# Patient Record
Sex: Male | Born: 1995 | Race: White | Hispanic: No | Marital: Married | Smoking: Never smoker
Health system: Southern US, Community
[De-identification: ages and names within clinical notes are randomized; demographics above are authoritative.]

## PROBLEM LIST (undated history)

## (undated) DIAGNOSIS — E669 Obesity, unspecified: Secondary | ICD-10-CM

## (undated) DIAGNOSIS — K219 Gastro-esophageal reflux disease without esophagitis: Secondary | ICD-10-CM

## (undated) DIAGNOSIS — M1990 Unspecified osteoarthritis, unspecified site: Secondary | ICD-10-CM

## (undated) HISTORY — PX: FINGER SURGERY: SHX640

## (undated) HISTORY — DX: Gastro-esophageal reflux disease without esophagitis: K21.9

## (undated) HISTORY — PX: TONSILLECTOMY: SUR1361

## (undated) HISTORY — DX: Obesity, unspecified: E66.9

## (undated) HISTORY — DX: Unspecified osteoarthritis, unspecified site: M19.90

## (undated) HISTORY — PX: TONSILECTOMY, ADENOIDECTOMY, BILATERAL MYRINGOTOMY AND TUBES: SHX2538

---
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
## 2015-04-09 ENCOUNTER — Ambulatory Visit (INDEPENDENT_AMBULATORY_CARE_PROVIDER_SITE_OTHER): Payer: BLUE CROSS/BLUE SHIELD | Admitting: Family Medicine

## 2015-04-09 ENCOUNTER — Encounter: Payer: Self-pay | Admitting: Family Medicine

## 2015-04-09 VITALS — BP 125/76 | HR 55 | Temp 97.9°F | Ht 73.73 in | Wt 220.6 lb

## 2015-04-09 DIAGNOSIS — L720 Epidermal cyst: Secondary | ICD-10-CM | POA: Diagnosis not present

## 2015-04-09 MED ORDER — NAPROXEN 500 MG PO TABS: 500.0000 mg | ORAL_TABLET | Freq: Two times a day (BID) | ORAL | Status: DC

## 2015-04-09 MED ORDER — CEPHALEXIN 500 MG PO CAPS: 500.0000 mg | ORAL_CAPSULE | Freq: Four times a day (QID) | ORAL | Status: DC

## 2015-04-09 NOTE — Patient Instructions (Signed)
Nice to meet you!  I think you have a cyst on your face that keeps getting infected. I wil treat the infection but you should see a dermatologist to get it removed.   PLease call [facility] for your back  Try the naproxen for pain instead of the mobic.    Epidermal Cyst An epidermal cyst is sometimes called a sebaceous cyst, epidermal inclusion cyst, or infundibular cyst. These cysts usually contain a substance that looks "pasty" or "cheesy" and may have a bad smell. This substance is a protein called keratin. Epidermal cysts are usually found on the face, neck, or trunk. They may also occur in the vaginal area or other parts of the genitalia of both men and women. Epidermal cysts are usually small, painless, slow-growing bumps or lumps that move freely under the skin. It is important not to try to pop them. This may cause an infection and lead to tenderness and swelling. CAUSES  Epidermal cysts may be caused by a deep penetrating injury to the skin or a plugged hair follicle, often associated with acne. SYMPTOMS  Epidermal cysts can become inflamed and cause:  Redness.  Tenderness.  Increased temperature of the skin over the bumps or lumps.  Grayish-white, bad smelling material that drains from the bump or lump. DIAGNOSIS  Epidermal cysts are easily diagnosed by your caregiver during an exam. Rarely, a tissue sample (biopsy) may be taken to rule out other conditions that may resemble epidermal cysts. TREATMENT   Epidermal cysts often get better and disappear on their own. They are rarely ever cancerous.  If a cyst becomes infected, it may become inflamed and tender. This may require opening and draining the cyst. Treatment with antibiotics may be necessary. When the infection is gone, the cyst may be removed with minor surgery.  Small, inflamed cysts can often be treated with antibiotics or by injecting steroid medicines.  Sometimes, epidermal cysts become large and  bothersome. If this happens, surgical removal in your caregiver's office may be necessary. HOME CARE INSTRUCTIONS  Only take over-the-counter or prescription medicines as directed by your caregiver.  Take your antibiotics as directed. Finish them even if you start to feel better. SEEK MEDICAL CARE IF:   Your cyst becomes tender, red, or swollen.  Your condition is not improving or is getting worse.  You have any other questions or concerns. MAKE SURE YOU:  Understand these instructions.  Will watch your condition.  Will get help right away if you are not doing well or get worse.   This information is not intended to replace advice given to you by your health care provider. Make sure you discuss any questions you have with your health care provider.   Document Released: 05/20/2004 Document Revised: 09/11/2011 Document Reviewed: 12/26/2010 Elsevier Interactive Patient Education [employer]

## 2015-04-09 NOTE — Progress Notes (Signed)
   HPI  Patient presents today here for concern for infection of his face and back pain.  Infection Patient claims he's had a lesion in his left-sided face is been there for at least 3 months. Initially it got large, red and drained bloody clear fluid without bruit was popped 3 months ago. It has persisted to come and go and get larger at times. Last night it began to use blood in clear discharge again.  He denies fevers, chills, sweats.  Back pain Has a long history of intermittent back pain with radicular symptoms. He states that he's been treated by a spinal surgeon with epidural injections, he states that it's getting bad again and that he probably needs to go back to surgery. He denies any problems with his bowels, walking, or saddle anesthesia.  PMH: Smoking status noted ROS: Per HPI  Objective: BP 125/76 mmHg  Pulse 55  Temp(Src) 97.9 F (36.6 C) (Oral)  Ht 6' 1.73" (1.873 m)  Wt 220 lb 9.6 oz (100.064 kg)  BMI 28.52 kg/m2 Gen: NAD, alert, cooperative with exam HEENT: NCAT CV: RRR, good S1/S2, no murmur Resp: CTABL, no wheezes, non-labored Skin: Left face with approximately 1 cm circular erythematous flat lesion, no discharge with expression, mild induration surrounding Neuro Strength 5/5 and sensation intact bilaterally, normal gait  Assessment and plan:  # Likely epidermal inclusion cyst, acute infection Treat with Keflex Refer to dermatology for possibility of removal, considering that this is on his face  # Back pain Neuro exam reassuring With radicular symptoms in his young age of his very appropriate for him to go see spinal surgery, he has a relationship at [facility] orthopedics were asked him to call there He will call if he needs a referral Change mobict to naproxen   Orders Placed This Encounter  Procedures  . Ambulatory referral to Dermatology    Referral Priority:  Routine    Referral Type:  Consultation    Referral Reason:  Specialty Services  Required    Requested Specialty:  Dermatology    Number of Visits Requested:  1    Meds ordered this encounter  Medications  . naproxen (NAPROSYN) 500 MG tablet    Sig: Take 1 tablet (500 mg total) by mouth 2 (two) times daily with a meal. As needed for pain    Dispense:  45 tablet    Refill:  0  . cephALEXin (KEFLEX) 500 MG capsule    Sig: Take 1 capsule (500 mg total) by mouth 4 (four) times daily.    Dispense:  21 capsule    Refill:  0    Murtis Sink, MD [facility] 04/09/2015, 11:44 AM

## 2015-05-11 ENCOUNTER — Telehealth: Payer: Self-pay | Admitting: Family Medicine

## 2015-05-11 NOTE — Telephone Encounter (Signed)
Spoke with father and told him that he could call to make his own appointment; number to Dr. Nita SellsJohn Hall given

## 2015-06-30 ENCOUNTER — Ambulatory Visit (INDEPENDENT_AMBULATORY_CARE_PROVIDER_SITE_OTHER): Payer: BLUE CROSS/BLUE SHIELD | Admitting: Physician Assistant

## 2015-06-30 ENCOUNTER — Encounter: Payer: Self-pay | Admitting: Physician Assistant

## 2015-06-30 VITALS — BP 139/87 | HR 101 | Temp 97.1°F | Ht 73.75 in | Wt 233.2 lb

## 2015-06-30 DIAGNOSIS — H6593 Unspecified nonsuppurative otitis media, bilateral: Secondary | ICD-10-CM | POA: Diagnosis not present

## 2015-06-30 DIAGNOSIS — J069 Acute upper respiratory infection, unspecified: Secondary | ICD-10-CM | POA: Diagnosis not present

## 2015-06-30 MED ORDER — AMOXICILLIN-POT CLAVULANATE 875-125 MG PO TABS: 1.0000 | ORAL_TABLET | Freq: Two times a day (BID) | ORAL | Status: DC

## 2015-06-30 NOTE — Progress Notes (Signed)
Subjective:     Patient ID: Cristian Sellers, male   DOB: 03/15/1996, 19 y.o.   MRN: [id number]  HPI  Bilat ear pain, cough , and congestion for 3-4 days Review of Systems  Constitutional: Positive for fatigue. Negative for fever, activity change and appetite change.  HENT: Positive for congestion, ear pain, postnasal drip, rhinorrhea, sinus pressure and sore throat. Negative for facial swelling.   Respiratory: Positive for cough. Negative for choking and shortness of breath.        Objective:   Physical Exam  Constitutional: He appears well-developed and well-nourished.  HENT:  Right Ear: External ear normal.  Left Ear: External ear normal.  Mouth/Throat: Oropharynx is clear and moist.  Neck: Neck supple.  Cardiovascular: Normal rate, regular rhythm and normal heart sounds.   No murmur heard. Pulmonary/Chest: Effort normal. He has no wheezes.  Lymphadenopathy:    He has no cervical adenopathy.  Nursing note and vitals reviewed.      Assessment:     Bilat OM URI     Plan:     Fluids Rest Augmentin bid x 10 days RF Alb Inh F/U prn

## 2015-06-30 NOTE — Patient Instructions (Signed)
Otitis Media With Effusion °Otitis media with effusion is the presence of fluid in the middle ear. This is a common problem in children, which often follows ear infections. It may be present for weeks or longer after the infection. Unlike an acute ear infection, otitis media with effusion refers only to fluid behind the ear drum and not infection. Children with repeated ear and sinus infections and allergy problems are the most likely to get otitis media with effusion. °CAUSES  °The most frequent cause of the fluid buildup is dysfunction of the eustachian tubes. These are the tubes that drain fluid in the ears to the back of the nose (nasopharynx). °SYMPTOMS  °· The main symptom of this condition is hearing loss. As a result, you or your child may: °¨ Listen to the TV at a loud volume. °¨ Not respond to questions. °¨ Ask "what" often when spoken to. °¨ Mistake or confuse one sound or word for another. °· There may be a sensation of fullness or pressure but usually not pain. °DIAGNOSIS  °· Your health care provider will diagnose this condition by examining you or your child's ears. °· Your health care provider may test the pressure in you or your child's ear with a tympanometer. °· A hearing test may be conducted if the problem persists. °TREATMENT  °· Treatment depends on the duration and the effects of the effusion. °· Antibiotics, decongestants, nose drops, and cortisone-type drugs (tablets or nasal spray) may not be helpful. °· Children with persistent ear effusions may have delayed language or behavioral problems. Children at risk for developmental delays in hearing, learning, and speech may require referral to a specialist earlier than children not at risk. °· You or your child's health care provider may suggest a referral to an ear, nose, and throat surgeon for treatment. The following may help restore normal hearing: °¨ Drainage of fluid. °¨ Placement of ear tubes (tympanostomy tubes). °¨ Removal of adenoids  (adenoidectomy). °HOME CARE INSTRUCTIONS  °· Avoid secondhand smoke. °· Infants who are breastfed are less likely to have this condition. °· Avoid feeding infants while they are lying flat. °· Avoid known environmental allergens. °· Avoid people who are sick. °SEEK MEDICAL CARE IF:  °· Hearing is not better in 3 months. °· Hearing is worse. °· Ear pain. °· Drainage from the ear. °· Dizziness. °MAKE SURE YOU:  °· Understand these instructions. °· Will watch your condition. °· Will get help right away if you are not doing well or get worse. °  °This information is not intended to replace advice given to you by your health care provider. Make sure you discuss any questions you have with your health care provider. °  °Document Released: 07/27/2004 Document Revised: 07/10/2014 Document Reviewed: 01/14/2013 °Elsevier Interactive Patient Education ©2016 Elsevier Inc. °Upper Respiratory Infection, Adult °Most upper respiratory infections (URIs) are a viral infection of the air passages leading to the lungs. A URI affects the nose, throat, and upper air passages. The most common type of URI is nasopharyngitis and is typically referred to as "the common cold." °URIs run their course and usually go away on their own. Most of the time, a URI does not require medical attention, but sometimes a bacterial infection in the upper airways can follow a viral infection. This is called a secondary infection. Sinus and middle ear infections are common types of secondary upper respiratory infections. °Bacterial pneumonia can also complicate a URI. A URI can worsen asthma and chronic obstructive pulmonary disease (  COPD). Sometimes, these complications can require emergency medical care and may be life threatening.  CAUSES Almost all URIs are caused by viruses. A virus is a type of germ and can spread from one person to another.  RISKS FACTORS You may be at risk for a URI if:   You smoke.   You have chronic heart or lung  disease.  You have a weakened defense (immune) system.   You are very young or very old.   You have nasal allergies or asthma.  You work in crowded or poorly ventilated areas.  You work in health care facilities or schools. SIGNS AND SYMPTOMS  Symptoms typically develop 2-3 days after you come in contact with a cold virus. Most viral URIs last 7-10 days. However, viral URIs from the influenza virus (flu virus) can last 14-18 days and are typically more severe. Symptoms may include:   Runny or stuffy (congested) nose.   Sneezing.   Cough.   Sore throat.   Headache.   Fatigue.   Fever.   Loss of appetite.   Pain in your forehead, behind your eyes, and over your cheekbones (sinus pain).  Muscle aches.  DIAGNOSIS  Your health care provider may diagnose a URI by:  Physical exam.  Tests to check that your symptoms are not due to another condition such as:  Strep throat.  Sinusitis.  Pneumonia.  Asthma. TREATMENT  A URI goes away on its own with time. It cannot be cured with medicines, but medicines may be prescribed or recommended to relieve symptoms. Medicines may help:  Reduce your fever.  Reduce your cough.  Relieve nasal congestion. HOME CARE INSTRUCTIONS   Take medicines only as directed by your health care provider.   Gargle warm saltwater or take cough drops to comfort your throat as directed by your health care provider.  Use a warm mist humidifier or inhale steam from a shower to increase air moisture. This may make it easier to breathe.  Drink enough fluid to keep your urine clear or pale yellow.   Eat soups and other clear broths and maintain good nutrition.   Rest as needed.   Return to work when your temperature has returned to normal or as your health care provider advises. You may need to stay home longer to avoid infecting others. You can also use a face mask and careful hand washing to prevent spread of the  virus.  Increase the usage of your inhaler if you have asthma.   Do not use any tobacco products, including cigarettes, chewing tobacco, or electronic cigarettes. If you need help quitting, ask your health care provider. PREVENTION  The best way to protect yourself from getting a cold is to practice good hygiene.   Avoid oral or hand contact with people with cold symptoms.   Wash your hands often if contact occurs.  There is no clear evidence that vitamin C, vitamin E, echinacea, or exercise reduces the chance of developing a cold. However, it is always recommended to get plenty of rest, exercise, and practice good nutrition.  SEEK MEDICAL CARE IF:   You are getting worse rather than better.   Your symptoms are not controlled by medicine.   You have chills.  You have worsening shortness of breath.  You have brown or red mucus.  You have yellow or brown nasal discharge.  You have pain in your face, especially when you bend forward.  You have a fever.  You have swollen neck glands.  You have pain while swallowing. °· You have white areas in the back of your throat. °SEEK IMMEDIATE MEDICAL CARE IF:  °· You have severe or persistent: °¨ Headache. °¨ Ear pain. °¨ Sinus pain. °¨ Chest pain. °· You have chronic lung disease and any of the following: °¨ Wheezing. °¨ Prolonged cough. °¨ Coughing up blood. °¨ A change in your usual mucus. °· You have a stiff neck. °· You have changes in your: °¨ Vision. °¨ Hearing. °¨ Thinking. °¨ Mood. °MAKE SURE YOU:  °· Understand these instructions. °· Will watch your condition. °· Will get help right away if you are not doing well or get worse. °  °This information is not intended to replace advice given to you by your health care provider. Make sure you discuss any questions you have with your health care provider. °  °Document Released: 12/13/2000 Document Revised: 11/03/2014 Document Reviewed: 09/24/2013 °Elsevier Interactive Patient Education  ©2016 Elsevier Inc. ° °

## 2015-07-02 MED ORDER — ALBUTEROL SULFATE HFA 108 (90 BASE) MCG/ACT IN AERS: 2.0000 | INHALATION_SPRAY | Freq: Four times a day (QID) | RESPIRATORY_TRACT | Status: DC | PRN

## 2015-07-02 NOTE — Progress Notes (Signed)
Pt called and we did not send in his refill for his albuterol inhaler when he was here this week. Office visit reviewed and inhaler refill sent to pharmacy.

## 2015-07-02 NOTE — Addendum Note (Signed)
Addended by: Ardine EngAYTON, JILL A on: 07/02/2015 09:56 AM   Modules accepted: Orders

## 2015-08-25 ENCOUNTER — Ambulatory Visit (INDEPENDENT_AMBULATORY_CARE_PROVIDER_SITE_OTHER): Payer: BLUE CROSS/BLUE SHIELD | Admitting: Pediatrics

## 2015-08-25 ENCOUNTER — Encounter: Payer: Self-pay | Admitting: Pediatrics

## 2015-08-25 VITALS — BP 133/78 | HR 80 | Temp 97.6°F | Ht 73.76 in | Wt 236.0 lb

## 2015-08-25 DIAGNOSIS — R6889 Other general symptoms and signs: Secondary | ICD-10-CM

## 2015-08-25 DIAGNOSIS — J069 Acute upper respiratory infection, unspecified: Secondary | ICD-10-CM | POA: Diagnosis not present

## 2015-08-25 DIAGNOSIS — J309 Allergic rhinitis, unspecified: Secondary | ICD-10-CM | POA: Diagnosis not present

## 2015-08-25 LAB — POCT INFLUENZA A/B
Influenza A, POC: NEGATIVE
Influenza B, POC: NEGATIVE

## 2015-08-25 MED ORDER — FLUTICASONE PROPIONATE 50 MCG/ACT NA SUSP: 2.0000 | Freq: Every day | NASAL | Status: DC

## 2015-08-25 NOTE — Progress Notes (Signed)
    Subjective:    Patient ID: Cristian Sellers, male    DOB: 07-Mar-1996, 20 y.o.   MRN: [id number]  CC: Nasal Congestion; Sore Throat; Generalized Body Aches; and Fatigue   HPI: Cristian Sadowsky is a 20 y.o. male presenting for Nasal Congestion; Sore Throat; Generalized Body Aches; and Fatigue  Came in 2 mo ago with ear pain, ongoing Coughing a lot Throat sore some +Congestion  Felt achey yesterday Not other fevers Started getting sick about 5 days ago Chest congestion and sore throat came first Ears still bothering him Has felt tired for a while     Depression screen PHQ 2/9 06/30/2015  Decreased Interest 0  Down, Depressed, Hopeless 0  PHQ - 2 Score 0     Relevant past medical, surgical, family and social history reviewed and updated as indicated. Interim medical history since our last visit reviewed. Allergies and medications reviewed and updated.    ROS: Per HPI unless specifically indicated above  History  Smoking status  . Never Smoker   Smokeless tobacco  . Never Used    Past Medical History Patient Active Problem List   Diagnosis Date Noted  . Head injury, closed, without LOC 09/15/2013  . Sinusitis 09/15/2013    Current Outpatient Prescriptions  Medication Sig Dispense Refill  . albuterol (PROVENTIL HFA;VENTOLIN HFA) 108 (90 Base) MCG/ACT inhaler Inhale 2 puffs into the lungs every 6 (six) hours as needed. Reported on 06/30/2015 1 Inhaler 3  . fluticasone (FLONASE) 50 MCG/ACT nasal spray Place 2 sprays into both nostrils daily. 16 g 6   No current facility-administered medications for this visit.       Objective:    BP 133/78 mmHg  Pulse 80  Temp(Src) 97.6 F (36.4 C) (Oral)  Ht 6' 1.76" (1.874 m)  Wt 236 lb (107.049 kg)  BMI 30.48 kg/m2  Wt Readings from Last 3 Encounters:  08/25/15 236 lb (107.049 kg) (98 %*, Z = 2.17)  06/30/15 233 lb 3.2 oz (105.779 kg) (98 %*, Z = 2.13)  04/09/15 220 lb 9.6 oz (100.064 kg) (97 %*, Z = 1.90)   * Growth  percentiles are based on CDC 2-20 Years data.     Gen: NAD, alert, cooperative with exam, NCAT, warm to touch EYES: EOMI, no scleral injection or icterus ENT:  R TM pearly gray, slightly splayed LR, L TM normal appearing, OP without erythema LYMPH: no cervical LAD CV: NRRR, normal S1/S2, no murmur, distal pulses 2+ b/l Resp: CTABL, no wheezes, slighlty prolonged exp with forced exhalation, normal WOB Abd: +BS, soft, NTND. Ext: No edema, warm Neuro: Alert and oriented     Assessment & Plan:    Cristian was seen today for nasal congestion, sore throat, generalized body aches and fatigue, due to acute URI, flu negative. Has been having poppin gin his ears before he got sick, ongoing since last clinic visit, no pain, no fevers. Likely due to allergies, try flonase. Discussed sx care for URI, return precautions.  Diagnoses and all orders for this visit:  Flu-like symptoms -     POCT Influenza A/B  Acute URI  Allergic rhinitis, unspecified allergic rhinitis type -     fluticasone (FLONASE) 50 MCG/ACT nasal spray; Place 2 sprays into both nostrils daily.     Follow up plan: Return if symptoms worsen or fail to improve.  Rex Kras, MD [facility] 08/25/2015, 10:35 AM

## 2015-08-25 NOTE — Patient Instructions (Signed)
Use flonase two sprays each nostril once a day Ibuprofen  three times a day Albuterol inhaler 2 puffs three times a day while sick

## 2015-10-14 ENCOUNTER — Encounter: Payer: Self-pay | Admitting: Family Medicine

## 2015-10-14 ENCOUNTER — Ambulatory Visit (INDEPENDENT_AMBULATORY_CARE_PROVIDER_SITE_OTHER): Payer: BLUE CROSS/BLUE SHIELD | Admitting: Family Medicine

## 2015-10-14 ENCOUNTER — Encounter (INDEPENDENT_AMBULATORY_CARE_PROVIDER_SITE_OTHER): Payer: Self-pay

## 2015-10-14 VITALS — BP 134/75 | HR 61 | Temp 98.1°F | Ht 73.77 in | Wt 240.6 lb

## 2015-10-14 DIAGNOSIS — S30860A Insect bite (nonvenomous) of lower back and pelvis, initial encounter: Secondary | ICD-10-CM

## 2015-10-14 DIAGNOSIS — W57XXXA Bitten or stung by nonvenomous insect and other nonvenomous arthropods, initial encounter: Secondary | ICD-10-CM

## 2015-10-14 MED ORDER — DOXYCYCLINE HYCLATE 100 MG PO TABS: 100.0000 mg | ORAL_TABLET | Freq: Two times a day (BID) | ORAL | Status: DC

## 2015-10-14 NOTE — Progress Notes (Signed)
BP 134/75 mmHg  Pulse 61  Temp(Src) 98.1 F (36.7 C) (Oral)  Ht 6' 1.77" (1.874 m)  Wt 240 lb 9.6 oz (109.135 kg)  BMI 31.08 kg/m2   Subjective:    Patient ID: Cristian Sellers, male    DOB: 12-09-1995, 20 y.o.   MRN: [id number]  HPI: Cristian Sellers is a 20 y.o. male presenting on 10/14/2015 for Tick Removal   HPI Tick bite Patient is coming in today because he had a tick that he found on his left back was scratching them pulled it out. He thinks it might of been and him for a couple of days. He has had some achiness but no rash or fevers or chills. He denies any other symptoms that is noted.  Relevant past medical, surgical, family and social history reviewed and updated as indicated. Interim medical history since our last visit reviewed. Allergies and medications reviewed and updated.  Review of Systems  Constitutional: Negative for fever and chills.  HENT: Negative for congestion, ear discharge and ear pain.   Eyes: Negative for discharge and visual disturbance.  Respiratory: Negative for shortness of breath and wheezing.   Cardiovascular: Negative for chest pain and leg swelling.  Gastrointestinal: Negative for abdominal pain, diarrhea and constipation.  Genitourinary: Negative for difficulty urinating.  Musculoskeletal: Negative for back pain and gait problem.  Skin: Positive for wound. Negative for rash.  Neurological: Negative for dizziness, syncope, light-headedness and headaches.  All other systems reviewed and are negative.   Per HPI unless specifically indicated above     Medication List       This list is accurate as of: 10/14/15 10:17 AM.  Always use your most recent med list.               albuterol 108 (90 Base) MCG/ACT inhaler  Commonly known as:  PROVENTIL HFA;VENTOLIN HFA  Inhale 2 puffs into the lungs every 6 (six) hours as needed. Reported on 06/30/2015     doxycycline 100 MG tablet  Commonly known as:  VIBRA-TABS  Take 1 tablet (100 mg total) by  mouth 2 (two) times daily. 1 po bid     fluticasone 50 MCG/ACT nasal spray  Commonly known as:  FLONASE  Place 2 sprays into both nostrils daily.           Objective:    BP 134/75 mmHg  Pulse 61  Temp(Src) 98.1 F (36.7 C) (Oral)  Ht 6' 1.77" (1.874 m)  Wt 240 lb 9.6 oz (109.135 kg)  BMI 31.08 kg/m2  Wt Readings from Last 3 Encounters:  10/14/15 240 lb 9.6 oz (109.135 kg) (99 %*, Z = 2.24)  08/25/15 236 lb (107.049 kg) (98 %*, Z = 2.17)  06/30/15 233 lb 3.2 oz (105.779 kg) (98 %*, Z = 2.13)   * Growth percentiles are based on CDC 2-20 Years data.    Physical Exam  Constitutional: He is oriented to person, place, and time. He appears well-developed and well-nourished. No distress.  Eyes: Conjunctivae and EOM are normal. Pupils are equal, round, and reactive to light. Right eye exhibits no discharge. No scleral icterus.  Cardiovascular: Normal rate, regular rhythm, normal heart sounds and intact distal pulses.   No murmur heard. Pulmonary/Chest: Effort normal and breath sounds normal. No respiratory distress. He has no wheezes.  Musculoskeletal: Normal range of motion. He exhibits no edema.  Neurological: He is alert and oriented to person, place, and time. Coordination normal.  Skin: Skin is warm and dry.  Lesion (Patient has a small papule where the tick was removed from on his left upper back. He was concerned about whether the head would still be in their and it does not appear to be so. There is no surrounding erythema or rash there is just a small pink papule i) noted. No rash noted. He is not diaphoretic.  Psychiatric: He has a normal mood and affect. His behavior is normal.  Vitals reviewed.     Assessment & Plan:   Problem List Items Addressed This Visit    None    Visit Diagnoses    Tick bite of back, initial encounter    -  Primary    Relevant Medications    doxycycline (VIBRA-TABS) 100 MG tablet        Follow up plan: Return if symptoms worsen or fail to  improve.  Counseling provided for all of the vaccine components No orders of the defined types were placed in this encounter.    Arville CareJoshua Dettinger, MD [facility] 10/14/2015, 10:17 AM

## 2016-01-26 ENCOUNTER — Other Ambulatory Visit: Payer: Self-pay

## 2016-01-26 MED ORDER — MELOXICAM 15 MG PO TABS: 15.0000 mg | ORAL_TABLET | Freq: Every day | ORAL | 3 refills | Status: DC

## 2016-03-20 ENCOUNTER — Ambulatory Visit (INDEPENDENT_AMBULATORY_CARE_PROVIDER_SITE_OTHER): Payer: BLUE CROSS/BLUE SHIELD | Admitting: Family

## 2016-03-20 ENCOUNTER — Encounter: Payer: Self-pay | Admitting: Family

## 2016-03-20 VITALS — BP 128/81 | HR 59 | Temp 97.0°F | Ht 73.5 in | Wt 241.0 lb

## 2016-03-20 DIAGNOSIS — L0291 Cutaneous abscess, unspecified: Secondary | ICD-10-CM | POA: Diagnosis not present

## 2016-03-20 MED ORDER — SULFAMETHOXAZOLE-TRIMETHOPRIM 800-160 MG PO TABS: 1.0000 | ORAL_TABLET | Freq: Two times a day (BID) | ORAL | 0 refills | Status: DC

## 2016-03-20 NOTE — Patient Instructions (Signed)

## 2016-03-20 NOTE — Progress Notes (Signed)
   Subjective:    Patient ID: Cristian Sellers, male    DOB: 07/30/1995, 20 y.o.   MRN: [id number]  HPI Pt presents to the office today with an abscess that he noticed it about a month ago. Pt states it has been draining "clear yellow" drainage at times. PT states it is unchanged, and that it will become larger at times and then drain. Pt states he has tried warm compresses, cleaning with peroxide with mild relief. Pt denies any insect bite or trauma to the area. PT denies itching and has intermittent aching  Pain of 2-3 out 10.    Review of Systems  Skin: Positive for wound.  All other systems reviewed and are negative.      Objective:   Physical Exam  Constitutional: He is oriented to person, place, and time. He appears well-developed and well-nourished.  Cardiovascular: Normal rate, regular rhythm, normal heart sounds and intact distal pulses.   Pulmonary/Chest: Effort normal and breath sounds normal.  Musculoskeletal: Normal range of motion. He exhibits no edema.  Neurological: He is alert and oriented to person, place, and time.  Skin: Skin is warm and dry. Lesion noted.  abscess 2X1.4 on right lower thigh with erythemas and crusted scab. No drainage present.  Psychiatric: He has a normal mood and affect. His behavior is normal. Judgment and thought content normal.      BP 128/81   Pulse (!) 59   Temp 97 F (36.1 C) (Oral)   Ht 6' 1.5" (1.867 m)   Wt 241 lb (109.3 kg)   BMI 31.36 kg/m      Assessment & Plan:  1. Abscess -Keep clean and dry -Warm compresses -Do not pick or    -RTO prn  - sulfamethoxazole-trimethoprim (BACTRIM DS) 800-160 MG tablet; Take 1 tablet by mouth 2 (two) times daily.  Dispense: 14 tablet; Refill: 0  Jannifer Rodneyhristy Deloise Marchant, FNP

## 2016-05-02 ENCOUNTER — Ambulatory Visit: Payer: BLUE CROSS/BLUE SHIELD | Admitting: Physician Assistant

## 2016-05-03 ENCOUNTER — Encounter: Payer: Self-pay | Admitting: Family Medicine

## 2016-05-03 ENCOUNTER — Ambulatory Visit (INDEPENDENT_AMBULATORY_CARE_PROVIDER_SITE_OTHER): Payer: BLUE CROSS/BLUE SHIELD | Admitting: Family Medicine

## 2016-05-03 VITALS — BP 128/74 | HR 69 | Temp 97.2°F | Ht 73.5 in | Wt 243.0 lb

## 2016-05-03 DIAGNOSIS — L0291 Cutaneous abscess, unspecified: Secondary | ICD-10-CM | POA: Diagnosis not present

## 2016-05-03 MED ORDER — SULFAMETHOXAZOLE-TRIMETHOPRIM 800-160 MG PO TABS: 1.0000 | ORAL_TABLET | Freq: Two times a day (BID) | ORAL | 0 refills | Status: DC

## 2016-05-03 NOTE — Progress Notes (Signed)
Subjective:    Patient ID: Cristian Sellers, male    DOB: 05/27/1996, 20 y.o.   MRN: [id number]  HPI Patient here for follow up on skin lesion of right upper thigh.The patient has had this lesion on his right upper thigh for a good while since August 2017. It flares up at times. His vital signs are stable including his temperature.    Patient Active Problem List   Diagnosis Date Noted  . Head injury, closed, without LOC 09/15/2013  . Sinusitis 09/15/2013   Outpatient Encounter Prescriptions as of 05/03/2016  Medication Sig  . albuterol (PROVENTIL HFA;VENTOLIN HFA) 108 (90 Base) MCG/ACT inhaler Inhale 2 puffs into the lungs every 6 (six) hours as needed. Reported on 06/30/2015  . [DISCONTINUED] fluticasone (FLONASE) 50 MCG/ACT nasal spray Place 2 sprays into both nostrils daily.  . meloxicam (MOBIC) 15 MG tablet Take 1 tablet (15 mg total) by mouth daily. (Patient not taking: Reported on 05/03/2016)  . [DISCONTINUED] sulfamethoxazole-trimethoprim (BACTRIM DS) 800-160 MG tablet Take 1 tablet by mouth 2 (two) times daily.   No facility-administered encounter medications on file as of 05/03/2016.       Review of Systems  Constitutional: Negative.   HENT: Negative.   Eyes: Negative.   Respiratory: Negative.   Cardiovascular: Negative.   Gastrointestinal: Negative.   Endocrine: Negative.   Genitourinary: Negative.   Musculoskeletal: Negative.   Skin: Negative.        Lesion / bite - right upper thigh since 02/2016  Allergic/Immunologic: Negative.   Neurological: Negative.   Hematological: Negative.   Psychiatric/Behavioral: Negative.        Objective:   Physical Exam  Constitutional: He is oriented to person, place, and time. He appears well-developed and well-nourished. No distress.  HENT:  Head: Normocephalic.  Eyes: Conjunctivae and EOM are normal. Pupils are equal, round, and reactive to light. Right eye exhibits no discharge. Left eye exhibits no discharge. No scleral icterus.   Neck: Normal range of motion.  Musculoskeletal: Normal range of motion. He exhibits no edema.  Neurological: He is alert and oriented to person, place, and time.  Skin: Skin is warm and dry. Rash noted. There is erythema.  He appears to be a healing abscess on the right anterior thigh. I was able to get some drainage for culture and sensitivity. We will treat him for a longer period time and wait for the culture and sensitivity to be returned.  Nursing note and vitals reviewed.  BP 128/74 (BP Location: Left Arm)   Pulse 69   Temp 97.2 F (36.2 C) (Oral)   Ht 6' 1.5" (1.867 m)   Wt 243 lb (110.2 kg)   BMI 31.63 kg/m         Assessment & Plan:  1. Abscess -Keep the wound cleaned with irrigation and rated 9 solution -Take antibiotic twice daily with food -Await culture and sensitivity and if antibiotic needs to be changed we will let you know  Meds ordered this encounter  Medications  . sulfamethoxazole-trimethoprim (BACTRIM DS) 800-160 MG tablet    Sig: Take 1 tablet by mouth 2 (two) times daily.    Dispense:  20 tablet    Refill:  0   Patient Instructions  Irrigate the wound when you're taking a shower and use Betadine solution following the irrigation. Take the antibiotic regularly twice daily with food for 3 weeks Avoid meloxicam as much as possible as both the meloxicam and sulfa together could irritate your stomach more.  Roe Coombson  Jennette Bill MD

## 2016-05-03 NOTE — Patient Instructions (Addendum)
Irrigate the wound when you're taking a shower and use Betadine solution following the irrigation. Take the antibiotic regularly twice daily with food for 3 weeks Avoid meloxicam as much as possible as both the meloxicam and sulfa together could irritate your stomach more.

## 2016-05-03 NOTE — Addendum Note (Signed)
Addended by: Magdalene RiverBULLINS, Apolo Cutshaw H on: 05/03/2016 11:29 AM   Modules accepted: Orders

## 2016-05-07 LAB — ANAEROBIC AND AEROBIC CULTURE

## 2016-05-17 ENCOUNTER — Other Ambulatory Visit: Payer: Self-pay | Admitting: *Deleted

## 2016-05-17 MED ORDER — VALACYCLOVIR HCL 1 G PO TABS: 1000.0000 mg | ORAL_TABLET | Freq: Two times a day (BID) | ORAL | 2 refills | Status: DC | PRN

## 2016-05-17 MED ORDER — SULFAMETHOXAZOLE-TRIMETHOPRIM 800-160 MG PO TABS: 1.0000 | ORAL_TABLET | Freq: Two times a day (BID) | ORAL | 0 refills | Status: DC

## 2016-05-22 ENCOUNTER — Encounter: Payer: Self-pay | Admitting: Family Medicine

## 2016-05-22 ENCOUNTER — Ambulatory Visit (INDEPENDENT_AMBULATORY_CARE_PROVIDER_SITE_OTHER): Payer: BLUE CROSS/BLUE SHIELD | Admitting: Family Medicine

## 2016-05-22 VITALS — BP 111/68 | HR 59 | Temp 97.3°F | Ht 73.5 in | Wt 243.0 lb

## 2016-05-22 DIAGNOSIS — L0291 Cutaneous abscess, unspecified: Secondary | ICD-10-CM | POA: Diagnosis not present

## 2016-05-22 DIAGNOSIS — A4902 Methicillin resistant Staphylococcus aureus infection, unspecified site: Secondary | ICD-10-CM | POA: Diagnosis not present

## 2016-05-22 MED ORDER — SULFAMETHOXAZOLE-TRIMETHOPRIM 800-160 MG PO TABS: 1.0000 | ORAL_TABLET | Freq: Two times a day (BID) | ORAL | 0 refills | Status: DC

## 2016-05-22 NOTE — Patient Instructions (Signed)
Continue and complete the antibiotics as directed The patient is to call us in 2-3 weeks regarding this skin infection. He will change to an antibacterial soap instead of one that has a lot of fragrance.

## 2016-05-22 NOTE — Progress Notes (Signed)
   Subjective:    Patient ID: Cristian Sellers, male    DOB: 05/21/1996, 20 y.o.   MRN: [id number]  HPI Patient here today for 3 week follow up on right thigh. He is still on the antibiotic.   Patient Active Problem List   Diagnosis Date Noted  . Head injury, closed, without LOC 09/15/2013  . Sinusitis 09/15/2013   Outpatient Encounter Prescriptions as of 05/22/2016  Medication Sig  . albuterol (PROVENTIL HFA;VENTOLIN HFA) 108 (90 Base) MCG/ACT inhaler Inhale 2 puffs into the lungs every 6 (six) hours as needed. Reported on 06/30/2015  . sulfamethoxazole-trimethoprim (BACTRIM DS) 800-160 MG tablet Take 1 tablet by mouth 2 (two) times daily.  . meloxicam (MOBIC) 15 MG tablet Take 1 tablet (15 mg total) by mouth daily. (Patient not taking: Reported on 05/22/2016)  . valACYclovir (VALTREX) 1000 MG tablet Take 1 tablet (1,000 mg total) by mouth 2 (two) times daily as needed. (Patient not taking: Reported on 05/22/2016)   No facility-administered encounter medications on file as of 05/22/2016.       Review of Systems  Constitutional: Negative.   HENT: Negative.   Eyes: Negative.   Respiratory: Negative.   Cardiovascular: Negative.   Gastrointestinal: Negative.   Endocrine: Negative.   Genitourinary: Negative.   Musculoskeletal: Negative.   Skin: Positive for wound (right thigh abcess).  Allergic/Immunologic: Negative.   Neurological: Negative.   Hematological: Negative.   Psychiatric/Behavioral: Negative.        Objective:   Physical Exam  Constitutional: He is oriented to person, place, and time. He appears well-developed and well-nourished. No distress.  HENT:  Head: Normocephalic.  Eyes: Conjunctivae and EOM are normal. Pupils are equal, round, and reactive to light. Right eye exhibits no discharge. Left eye exhibits no discharge. No scleral icterus.  Neck: Normal range of motion.  Musculoskeletal: Normal range of motion.  Neurological: He is alert and oriented to person,  place, and time.  Skin: Skin is warm and dry. No rash noted.  The area of infection on the right anterior thigh appears to be healing well. We will continue with another 2-3 weeks of antibiotics to ensure continued healing. Septra is the appropriate antibiotic based on the culture and sensitivity.  Psychiatric: He has a normal mood and affect. His behavior is normal. Judgment and thought content normal.  Nursing note and vitals reviewed.  BP (!) 150/98 (BP Location: Right Arm)   Pulse 61   Temp 97.3 F (36.3 C) (Oral)   Ht 6' 1.5" (1.867 m)   Wt 243 lb (110.2 kg)   BMI 31.63 kg/m         Assessment & Plan:  1. Abscess -Continue antibiotic as directed  2. MRSA infection -This appears to be healing well he will continue with the antibiotic for 2-3 more weeks and call my nurse after that. Time as far as how the skin infection is doing then.  Meds ordered this encounter  Medications  . sulfamethoxazole-trimethoprim (BACTRIM DS) 800-160 MG tablet    Sig: Take 1 tablet by mouth 2 (two) times daily.    Dispense:  20 tablet    Refill:  0   Patient Instructions  Continue and complete the antibiotics as directed The patient is to call us in 2-3 weeks regarding this skin infection. He will change to an antibacterial soap instead of one that has a lot of fragrance.  Nyra Capeson W. Dorena Dorfman MD

## 2017-01-11 ENCOUNTER — Ambulatory Visit: Payer: BLUE CROSS/BLUE SHIELD | Admitting: Family

## 2017-01-12 ENCOUNTER — Ambulatory Visit: Payer: BLUE CROSS/BLUE SHIELD | Admitting: Family Medicine

## 2017-01-12 ENCOUNTER — Encounter: Payer: Self-pay | Admitting: Family

## 2017-01-19 ENCOUNTER — Encounter: Payer: Self-pay | Admitting: Family Medicine

## 2017-01-19 ENCOUNTER — Ambulatory Visit (INDEPENDENT_AMBULATORY_CARE_PROVIDER_SITE_OTHER): Payer: BLUE CROSS/BLUE SHIELD | Admitting: Family Medicine

## 2017-01-19 VITALS — BP 127/83 | HR 60 | Temp 96.7°F | Ht 73.5 in | Wt 250.6 lb

## 2017-01-19 DIAGNOSIS — L723 Sebaceous cyst: Secondary | ICD-10-CM | POA: Diagnosis not present

## 2017-01-19 DIAGNOSIS — K219 Gastro-esophageal reflux disease without esophagitis: Secondary | ICD-10-CM

## 2017-01-19 MED ORDER — PANTOPRAZOLE SODIUM 40 MG PO TBEC
40.0000 mg | DELAYED_RELEASE_TABLET | Freq: Every day | ORAL | 2 refills | Status: DC
Start: 2017-01-19 — End: 2017-03-30

## 2017-01-19 NOTE — Patient Instructions (Addendum)
Great to see you!  Start protonix 1 pill once daily for 3 months  TRy Pepcid 20 mg ( over the counter Twice daily) and follow up about 3-4 weeks after that  Come back fasting

## 2017-01-19 NOTE — Progress Notes (Signed)
   HPI  Patient presents today or heartburn.  Patient explains that he's had 2 or 3 episodes similar to this previously. He describes a 6 month history of frequent burning in the upper chest and throat. He states that last at least 2 hours a day and happens more often at night when he lays down. He's tried a PPI, he believes Prilosec, from a friend and had very good relief.  He denies any fever, chills, sweats. He has no problems eating or drinking. He does occasionally have hoarseness.  She also has questions about a small swelling on the left neck. It seems a getting worse, larger, however not painful.   PMH: Smoking status noted ROS: Per HPI  Objective: BP 127/83   Pulse 60   Temp (!) 96.7 F (35.9 C) (Oral)   Ht 6' 1.5" (1.867 m)   Wt 250 lb 9.6 oz (113.7 kg)   BMI 32.61 kg/m  Gen: NAD, alert, cooperative with exam HEENT: NCAT CV: RRR, good S1/S2, no murmur Resp: CTABL, no wheezes, non-labored Ext: No edema, warm Neuro: Alert and oriented, No gross deficits  Assessment and plan:  # GERD New problem Start PPI 3 months, then de-escalate to scheduled H2 blockers twice daily and follow-up 3-4 weeks after that. Check for H. pylori if symptoms return on H2 blockers. Consider GI referral if persistent or difficult to manage given his age  # Sebaceous cyst New problem Largely asymptomatic at this time, left neck Discussed supportive care including hot compress, however at this time is not tender or painful and does not need any intervention. Watchful waiting Discussed lancing it becomes inflamed, and surgical removal with dermatology if recurrently inflamed.   Meds ordered this encounter  Medications  . pantoprazole (PROTONIX) 40 MG tablet    Sig: Take 1 tablet (40 mg total) by mouth daily.    Dispense:  30 tablet    Refill:  2    Murtis SinkSam Yessica Putnam, MD [facility] 01/19/2017, 10:02 AM

## 2017-01-31 ENCOUNTER — Ambulatory Visit (INDEPENDENT_AMBULATORY_CARE_PROVIDER_SITE_OTHER): Payer: BLUE CROSS/BLUE SHIELD | Admitting: Family Medicine

## 2017-01-31 ENCOUNTER — Encounter: Payer: Self-pay | Admitting: Family Medicine

## 2017-01-31 VITALS — BP 125/79 | HR 75 | Temp 96.9°F | Ht 73.5 in | Wt 249.0 lb

## 2017-01-31 DIAGNOSIS — Z0000 Encounter for general adult medical examination without abnormal findings: Secondary | ICD-10-CM

## 2017-01-31 DIAGNOSIS — J301 Allergic rhinitis due to pollen: Secondary | ICD-10-CM

## 2017-01-31 DIAGNOSIS — M5440 Lumbago with sciatica, unspecified side: Secondary | ICD-10-CM

## 2017-01-31 DIAGNOSIS — R945 Abnormal results of liver function studies: Secondary | ICD-10-CM

## 2017-01-31 DIAGNOSIS — R7989 Other specified abnormal findings of blood chemistry: Secondary | ICD-10-CM

## 2017-01-31 DIAGNOSIS — Z6832 Body mass index (BMI) 32.0-32.9, adult: Secondary | ICD-10-CM | POA: Diagnosis not present

## 2017-01-31 DIAGNOSIS — K219 Gastro-esophageal reflux disease without esophagitis: Secondary | ICD-10-CM

## 2017-01-31 DIAGNOSIS — J9801 Acute bronchospasm: Secondary | ICD-10-CM

## 2017-01-31 LAB — URINALYSIS, COMPLETE
Bilirubin, UA: NEGATIVE
Glucose, UA: NEGATIVE
Ketones, UA: NEGATIVE
LEUKOCYTES UA: NEGATIVE
Nitrite, UA: NEGATIVE
PH UA: 6 (ref 5.0–7.5)
PROTEIN UA: NEGATIVE
RBC, UA: NEGATIVE
Specific Gravity, UA: 1.02 (ref 1.005–1.030)
Urobilinogen, Ur: 0.2 mg/dL (ref 0.2–1.0)

## 2017-01-31 LAB — MICROSCOPIC EXAMINATION
BACTERIA UA: NONE SEEN
Epithelial Cells (non renal): NONE SEEN /hpf (ref 0–10)
RBC, UA: NONE SEEN /hpf (ref 0–?)
RENAL EPITHEL UA: NONE SEEN /HPF
WBC UA: NONE SEEN /HPF (ref 0–?)

## 2017-01-31 MED ORDER — FLUTICASONE PROPIONATE 50 MCG/ACT NA SUSP: 2.0000 | Freq: Every day | NASAL | 6 refills | Status: DC

## 2017-01-31 MED ORDER — ALBUTEROL SULFATE HFA 108 (90 BASE) MCG/ACT IN AERS
2.0000 | INHALATION_SPRAY | Freq: Four times a day (QID) | RESPIRATORY_TRACT | 3 refills | Status: DC | PRN
Start: 2017-01-31 — End: 2018-05-21

## 2017-01-31 NOTE — Patient Instructions (Addendum)
Continue current medications. Continue good therapeutic lifestyle changes which include good diet and exercise. If an FOBT was given today- please return it to our front desk.  **Flu shots are available--- please call and schedule a FLU-CLINIC appointment**  After your visit with us today you will receive a survey in the mail or online from [employer] regarding your care with us. Please take a moment to fill this out. Your feedback is very important to us as you can help us better understand your patient needs as well as improve your experience and satisfaction. WE CARE ABOUT YOU!!!   The patient should follow-up with the orthopedist and the neurosurgeon for his ongoing back pain and left hip pain. He will make arrangements to get these follow-up appointments He should make every effort to reduce his sugar intake and reduce his BMI closer to 25 instead of 32. He should watch the sugar in his diet regularly and reduce his intake of soda drinks daily and reduce the intake of caffeine after lunch every day.  If he needs something for sleep he can try melatonin over-the-counter 3 mg Drink plenty of water and stay well hydrated He should continue to use the albuterol inhaler as a rescue inhaler and we will call in a prescription of Flonase to use 1 spray each nostril at bedtime. We will call with the results of the lab work as soon as these results become available

## 2017-01-31 NOTE — Progress Notes (Signed)
Subjective:    Patient ID: Cristian Sellers, male    DOB: [birth year], 21 y.o.   MRN: [id number]  HPI Patient is here today for annual wellness exam and follow up of chronic medical problems which includes gerd. He is taking medication regularly.The patient is doing well overall but does complain of some fatigue and insomnia. He will get blood work today lab work today and will be given an FOBT to return. His weight is 249 pounds. This places his BMI at 32. We will discuss some kind of weight watch program to help lose weight and to help bring his BMI down to a lower number. The patient does drink sodas daily. He denies any chest pain or shortness of breath. He denies any trouble with nausea vomiting diarrhea blood in the stool or black tarry bowel movements. He is passing his water without problems. His grandmother did die of metastatic colon cancer. He did mention that he has had thyroid issues in the past. The patient's biggest concern is that he has this ongoing back and left hip pain and this occurs quite frequently especially when he is physically active. His asthma has been stable.     Patient Active Problem List   Diagnosis Date Noted  . Head injury, closed, without LOC 09/15/2013  . Sinusitis 09/15/2013   Outpatient Encounter Prescriptions as of 01/31/2017  Medication Sig  . pantoprazole (PROTONIX) 40 MG tablet Take 1 tablet (40 mg total) by mouth daily.  Marland Kitchen albuterol (PROVENTIL HFA;VENTOLIN HFA) 108 (90 Base) MCG/ACT inhaler Inhale 2 puffs into the lungs every 6 (six) hours as needed. Reported on 06/30/2015 (Patient not taking: Reported on 01/31/2017)  . meloxicam (MOBIC) 15 MG tablet Take 1 tablet (15 mg total) by mouth daily. (Patient not taking: Reported on 01/31/2017)  . valACYclovir (VALTREX) 1000 MG tablet Take 1 tablet (1,000 mg total) by mouth 2 (two) times daily as needed. (Patient not taking: Reported on 01/31/2017)   No facility-administered encounter medications on file as of 01/31/2017.        Review of Systems  Constitutional: Positive for fatigue.       Not sleeping well   HENT: Negative.   Eyes: Negative.   Respiratory: Negative.   Cardiovascular: Negative.   Gastrointestinal: Negative.   Endocrine: Negative.   Genitourinary: Negative.   Musculoskeletal: Negative.   Skin: Negative.   Allergic/Immunologic: Negative.   Neurological: Negative.   Hematological: Negative.   Psychiatric/Behavioral: Negative.        Objective:   Physical Exam  Constitutional: He is oriented to person, place, and time. He appears well-developed and well-nourished. No distress.  Pleasant and alert  HENT:  Head: Normocephalic and atraumatic.  Right Ear: External ear normal.  Left Ear: External ear normal.  Mouth/Throat: Oropharynx is clear and moist. No oropharyngeal exudate.  Nasal congestion right greater than left  Eyes: Pupils are equal, round, and reactive to light. Conjunctivae and EOM are normal. Right eye exhibits no discharge. Left eye exhibits no discharge. No scleral icterus.  Neck: Normal range of motion. Neck supple. No thyromegaly present.  No thyromegaly anterior cervical adenopathy  Cardiovascular: Normal rate, regular rhythm, normal heart sounds and intact distal pulses.   No murmur heard. Heart is regular at 72/m  Pulmonary/Chest: Effort normal and breath sounds normal. No respiratory distress. He has no wheezes. He has no rales. He exhibits no tenderness.  No axillary adenopathy with good breath sounds anteriorly and posteriorly without wheezing  Abdominal: Soft. Bowel sounds are  normal. He exhibits no mass. There is no tenderness. There is no rebound and no guarding.  No abdominal tenderness masses or organ enlargement or bruits  Genitourinary: Rectum normal and penis normal.  Genitourinary Comments: The external genitalia were within normal limits. No hernias were palpable.  Musculoskeletal: Normal range of motion. He exhibits no edema.  Leg raising was  good.  Lymphadenopathy:    He has no cervical adenopathy.  Neurological: He is alert and oriented to person, place, and time. No cranial nerve deficit.  Lower extremity reflexes were diminished slightly on the right compared to the left  Skin: Skin is warm and dry. No rash noted.  Psychiatric: He has a normal mood and affect. His behavior is normal. Judgment and thought content normal.  Nursing note and vitals reviewed.  BP 125/79 (BP Location: Right Arm)   Sellers 75   Temp (!) 96.9 F (36.1 C) (Oral)   Ht 6' 1.5" (1.867 m)   Wt 249 lb (112.9 kg)   BMI 32.41 kg/m         Assessment & Plan:  1. Gastroesophageal reflux disease, esophagitis presence not specified -Watch stomach and eat healthy and take Zantac over-the-counter if needed - CBC with Differential/Platelet - Hepatic function panel  2. Annual physical exam -Continue to use rescue inhaler as needed -Check with the back specialist that you have seen at 1 to see you in a 6 month follow-up and make a return visit to see him - Lipid panel - CBC with Differential/Platelet - BMP8+EGFR - Hepatic function panel - VITAMIN D 25 Hydroxy (Vit-D Deficiency, Fractures) - Thyroid Panel With TSH - Urinalysis, Complete  3. Bilateral low back pain with sciatica, sciatica laterality unspecified, unspecified chronicity -Follow-up with neurosurgery and orthopedic surgery as needed  4. BMI 32.0-32.9,adult -Continue with aggressive therapeutic lifestyle changes to reduce BMI closer to 25 with less sugar less bread and more physical activity  5. Bronchospasm -Continue to keep albuterol inhaler updated and use as needed as a rescue inhaler  6. Seasonal allergic rhinitis due to pollen -Flonase 1 spray each nostril at bedtime  No orders of the defined types were placed in this encounter.  Patient Instructions  Continue current medications. Continue good therapeutic lifestyle changes which include good diet and exercise. If an FOBT  was given today- please return it to our front desk.  **Flu shots are available--- please call and schedule a FLU-CLINIC appointment**  After your visit with [place] today you will receive a survey in the mail or online from [employer] regarding your care with [place]. Please take a moment to fill this out. Your feedback is very important to [place] as you can help [place] better understand your patient needs as well as improve your experience and satisfaction. WE CARE ABOUT YOU!!!   The patient should follow-up with the orthopedist and the neurosurgeon for his ongoing back pain and left hip pain. He will make arrangements to get these follow-up appointments He should make every effort to reduce his sugar intake and reduce his BMI closer to 25 instead of 32. He should watch the sugar in his diet regularly and reduce his intake of soda drinks daily and reduce the intake of caffeine after lunch every day.  If he needs something for sleep he can try melatonin over-the-counter 3 mg Drink plenty of water and stay well hydrated He should continue to use the albuterol inhaler as a rescue inhaler and we will call in a prescription of Flonase to  use 1 spray each nostril at bedtime. We will call with the results of the lab work as soon as these results become available  Arrie Senate MD

## 2017-01-31 NOTE — Addendum Note (Signed)
Addended by: Magdalene RiverBULLINS, Shermaine Brigham H on: 01/31/2017 12:12 PM   Modules accepted: Orders

## 2017-02-01 LAB — BMP8+EGFR
BUN / CREAT RATIO: 7 — AB (ref 9–20)
BUN: 8 mg/dL (ref 6–20)
CHLORIDE: 100 mmol/L (ref 96–106)
CO2: 24 mmol/L (ref 20–29)
Calcium: 9.3 mg/dL (ref 8.7–10.2)
Creatinine, Ser: 1.09 mg/dL (ref 0.76–1.27)
GFR calc Af Amer: 112 mL/min/{1.73_m2} (ref >59)
GFR calc non Af Amer: 97 mL/min/{1.73_m2} (ref >59)
GLUCOSE: 81 mg/dL (ref 65–99)
Potassium: 4.6 mmol/L (ref 3.5–5.2)
SODIUM: 141 mmol/L (ref 134–144)

## 2017-02-01 LAB — CBC WITH DIFFERENTIAL/PLATELET
BASOS ABS: 0 10*3/uL (ref 0.0–0.2)
Basos: 0 %
EOS (ABSOLUTE): 0.4 10*3/uL (ref 0.0–0.4)
EOS: 5 %
Hematocrit: 46.1 % (ref 37.5–51.0)
Hemoglobin: 17 g/dL (ref 13.0–17.7)
IMMATURE GRANULOCYTES: 0 %
Immature Grans (Abs): 0 10*3/uL (ref 0.0–0.1)
LYMPHS ABS: 1.8 10*3/uL (ref 0.7–3.1)
Lymphs: 23 %
MCH: 30.4 pg (ref 26.6–33.0)
MCHC: 36.9 g/dL — AB (ref 31.5–35.7)
MCV: 83 fL (ref 79–97)
MONOCYTES: 7 %
Monocytes Absolute: 0.6 10*3/uL (ref 0.1–0.9)
NEUTROS PCT: 65 %
Neutrophils Absolute: 5.2 10*3/uL (ref 1.4–7.0)
PLATELETS: 276 10*3/uL (ref 150–379)
RBC: 5.59 x10E6/uL (ref 4.14–5.80)
RDW: 12.8 % (ref 12.3–15.4)
WBC: 8 10*3/uL (ref 3.4–10.8)

## 2017-02-01 LAB — LIPID PANEL
CHOL/HDL RATIO: 3.2 ratio (ref 0.0–5.0)
Cholesterol, Total: 138 mg/dL (ref 100–199)
HDL: 43 mg/dL (ref >39)
LDL Calculated: 76 mg/dL (ref 0–99)
Triglycerides: 95 mg/dL (ref 0–149)
VLDL Cholesterol Cal: 19 mg/dL (ref 5–40)

## 2017-02-01 LAB — HEPATIC FUNCTION PANEL
ALBUMIN: 4.8 g/dL (ref 3.5–5.5)
ALT: 74 IU/L — ABNORMAL HIGH (ref 0–44)
AST: 45 IU/L — ABNORMAL HIGH (ref 0–40)
Alkaline Phosphatase: 75 IU/L (ref 39–117)
Bilirubin Total: 0.9 mg/dL (ref 0.0–1.2)
Bilirubin, Direct: 0.23 mg/dL (ref 0.00–0.40)
TOTAL PROTEIN: 7.2 g/dL (ref 6.0–8.5)

## 2017-02-01 LAB — VITAMIN D 25 HYDROXY (VIT D DEFICIENCY, FRACTURES): VIT D 25 HYDROXY: 31.6 ng/mL (ref 30.0–100.0)

## 2017-02-01 LAB — THYROID PANEL WITH TSH
Free Thyroxine Index: 2.4 (ref 1.2–4.9)
T3 Uptake Ratio: 29 % (ref 24–39)
T4, Total: 8.4 ug/dL (ref 4.5–12.0)
TSH: 3.31 u[IU]/mL (ref 0.450–4.500)

## 2017-02-02 ENCOUNTER — Telehealth: Payer: Self-pay | Admitting: Family

## 2017-02-02 NOTE — Telephone Encounter (Signed)
Pt notified of results Verbalizes understanding Pt will come in for repeat labs

## 2017-02-26 NOTE — Addendum Note (Signed)
Addended by: Tamera Punt on: 02/26/2017 09:38 AM   Modules accepted: Orders

## 2017-03-30 ENCOUNTER — Other Ambulatory Visit: Payer: Self-pay | Admitting: Family Medicine

## 2017-05-16 ENCOUNTER — Ambulatory Visit: Payer: BLUE CROSS/BLUE SHIELD | Admitting: Physician Assistant

## 2017-05-16 ENCOUNTER — Encounter: Payer: Self-pay | Admitting: Physician Assistant

## 2017-05-16 VITALS — BP 124/83 | HR 82 | Temp 96.8°F | Ht 73.5 in | Wt 253.0 lb

## 2017-05-16 DIAGNOSIS — J0100 Acute maxillary sinusitis, unspecified: Secondary | ICD-10-CM | POA: Diagnosis not present

## 2017-05-16 DIAGNOSIS — J40 Bronchitis, not specified as acute or chronic: Secondary | ICD-10-CM

## 2017-05-16 MED ORDER — DOXYCYCLINE HYCLATE 100 MG PO TABS: 100.0000 mg | ORAL_TABLET | Freq: Two times a day (BID) | ORAL | 0 refills | Status: DC

## 2017-05-16 MED ORDER — PANTOPRAZOLE SODIUM 40 MG PO TBEC: DELAYED_RELEASE_TABLET | ORAL | 3 refills | Status: DC

## 2017-05-16 NOTE — Progress Notes (Signed)
BP 124/83   Pulse 82   Temp (!) 96.8 F (36 C) (Oral)   Ht 6' 1.5" (1.867 m)   Wt 253 lb (114.8 kg)   BMI 32.93 kg/m    Subjective:    Patient ID: Cristian Sellers, male    DOB: 09/02/1995, 21 y.o.   MRN: [id number]  HPI: Cristian Leverett is a 21 y.o. male presenting on 05/16/2017 for Cough; Nasal Congestion; Sinus Problem; and Sore Throat  This patient has had many days of sinus headache and postnasal drainage. There is copious drainage at times. Denies any fever at this time. There has been a history of sinus infections in the past.  No history of sinus surgery. There is cough at night. It has become more prevalent in recent days.   Relevant past medical, surgical, family and social history reviewed and updated as indicated. Allergies and medications reviewed and updated.  Past Medical History:  Diagnosis Date  . Asthma     Past Surgical History:  Procedure Laterality Date  . TONSILECTOMY, ADENOIDECTOMY, BILATERAL MYRINGOTOMY AND TUBES      Review of Systems  Constitutional: Positive for fatigue. Negative for appetite change and fever.  HENT: Positive for congestion, sinus pressure, sinus pain and sore throat.   Eyes: Negative.  Negative for pain and visual disturbance.  Respiratory: Positive for cough and wheezing. Negative for chest tightness and shortness of breath.   Cardiovascular: Negative.  Negative for chest pain, palpitations and leg swelling.  Gastrointestinal: Negative.  Negative for abdominal pain, diarrhea, nausea and vomiting.  Endocrine: Negative.   Genitourinary: Negative.   Musculoskeletal: Negative for back pain and myalgias.  Skin: Negative.  Negative for color change and rash.  Neurological: Positive for headaches. Negative for weakness and numbness.  Psychiatric/Behavioral: Negative.     Allergies as of 05/16/2017   No Known Allergies     Medication List        Accurate as of 05/16/17 11:01 AM. Always use your most recent med list.            albuterol 108 (90 Base) MCG/ACT inhaler Commonly known as:  PROVENTIL HFA;VENTOLIN HFA Inhale 2 puffs into the lungs every 6 (six) hours as needed. Reported on 06/30/2015   doxycycline 100 MG tablet Commonly known as:  VIBRA-TABS Take 1 tablet (100 mg total) 2 (two) times daily by mouth.   fluticasone 50 MCG/ACT nasal spray Commonly known as:  FLONASE Place 2 sprays into both nostrils daily.   meloxicam 15 MG tablet Commonly known as:  MOBIC Take 1 tablet (15 mg total) by mouth daily.   pantoprazole 40 MG tablet Commonly known as:  PROTONIX TAKE ONE (1) TABLET EACH DAY   valACYclovir 1000 MG tablet Commonly known as:  VALTREX Take 1 tablet (1,000 mg total) by mouth 2 (two) times daily as needed.          Objective:    BP 124/83   Pulse 82   Temp (!) 96.8 F (36 C) (Oral)   Ht 6' 1.5" (1.867 m)   Wt 253 lb (114.8 kg)   BMI 32.93 kg/m   No Known Allergies  Physical Exam  Constitutional: He is oriented to person, place, and time. He appears well-developed and well-nourished.  HENT:  Head: Normocephalic and atraumatic.  Right Ear: Tympanic membrane and external ear normal. No middle ear effusion.  Left Ear: Tympanic membrane and external ear normal.  No middle ear effusion.  Nose: Mucosal edema and rhinorrhea present. Right sinus  exhibits frontal sinus tenderness. Right sinus exhibits no maxillary sinus tenderness. Left sinus exhibits frontal sinus tenderness. Left sinus exhibits no maxillary sinus tenderness.  Mouth/Throat: Uvula is midline. Posterior oropharyngeal erythema present.  Eyes: Conjunctivae and EOM are normal. Pupils are equal, round, and reactive to light. Right eye exhibits no discharge. Left eye exhibits no discharge.  Neck: Normal range of motion.  Cardiovascular: Normal rate, regular rhythm and normal heart sounds.  Pulmonary/Chest: Effort normal and breath sounds normal. No respiratory distress. He has no wheezes.  Abdominal: Soft.   Lymphadenopathy:    He has no cervical adenopathy.  Neurological: He is alert and oriented to person, place, and time.  Skin: Skin is warm and dry.  Psychiatric: He has a normal mood and affect.        Assessment & Plan:   1. Acute non-recurrent maxillary sinusitis - doxycycline (VIBRA-TABS) 100 MG tablet; Take 1 tablet (100 mg total) 2 (two) times daily by mouth.  Dispense: 20 tablet; Refill: 0  2. Bronchitis     Current Outpatient Medications:  .  albuterol (PROVENTIL HFA;VENTOLIN HFA) 108 (90 Base) MCG/ACT inhaler, Inhale 2 puffs into the lungs every 6 (six) hours as needed. Reported on 06/30/2015, Disp: 1 Inhaler, Rfl: 3 .  meloxicam (MOBIC) 15 MG tablet, Take 1 tablet (15 mg total) by mouth daily., Disp: 30 tablet, Rfl: 3 .  pantoprazole (PROTONIX) 40 MG tablet, TAKE ONE (1) TABLET EACH DAY, Disp: 90 tablet, Rfl: 3 .  valACYclovir (VALTREX) 1000 MG tablet, Take 1 tablet (1,000 mg total) by mouth 2 (two) times daily as needed., Disp: 60 tablet, Rfl: 2 .  doxycycline (VIBRA-TABS) 100 MG tablet, Take 1 tablet (100 mg total) 2 (two) times daily by mouth., Disp: 20 tablet, Rfl: 0 .  fluticasone (FLONASE) 50 MCG/ACT nasal spray, Place 2 sprays into both nostrils daily. (Patient not taking: Reported on 05/16/2017), Disp: 16 g, Rfl: 6 Continue all other maintenance medications as listed above.  Follow up plan: Return if symptoms worsen or fail to improve.  Educational handout given for survey  Remus LofflerAngel S. Nicklous Aburto PA-C [facility] [address]  [place], [place] [address] [phone]   05/16/2017, 11:01 AM

## 2017-05-16 NOTE — Patient Instructions (Signed)
In a few days you may receive a survey in the mail or online from [employer] regarding your visit with us today. Please take a moment to fill this out. Your feedback is very important to our whole office. It can help us better understand your needs as well as improve your experience and satisfaction. Thank you for taking your time to complete it. We care about you.  Abiel Antrim, PA-C  

## 2017-08-21 ENCOUNTER — Ambulatory Visit (INDEPENDENT_AMBULATORY_CARE_PROVIDER_SITE_OTHER): Payer: BLUE CROSS/BLUE SHIELD | Admitting: Physician Assistant

## 2017-08-21 ENCOUNTER — Encounter: Payer: Self-pay | Admitting: Physician Assistant

## 2017-08-21 VITALS — BP 129/76 | HR 68 | Temp 98.4°F | Ht 73.5 in | Wt 252.4 lb

## 2017-08-21 DIAGNOSIS — J4520 Mild intermittent asthma, uncomplicated: Secondary | ICD-10-CM | POA: Diagnosis not present

## 2017-08-21 DIAGNOSIS — Z111 Encounter for screening for respiratory tuberculosis: Secondary | ICD-10-CM | POA: Diagnosis not present

## 2017-08-21 DIAGNOSIS — Z23 Encounter for immunization: Secondary | ICD-10-CM

## 2017-08-21 NOTE — Patient Instructions (Signed)
In a few days you may receive a survey in the mail or online from [employer] regarding your visit with us today. Please take a moment to fill this out. Your feedback is very important to our whole office. It can help us better understand your needs as well as improve your experience and satisfaction. Thank you for taking your time to complete it. We care about you.  Vienne Corcoran, PA-C  

## 2017-08-21 NOTE — Progress Notes (Signed)
BP 129/76   Pulse 68   Temp 98.4 F (36.9 C) (Oral)   Ht 6' 1.5" (1.867 m)   Wt 252 lb 6.4 oz (114.5 kg)   BMI 32.85 kg/m    Subjective:    Patient ID: Cristian Sellers, male    DOB: May 06, 1996, 22 y.o.   MRN: [id number]  HPI: Cristian Sellers is a 22 y.o. male presenting on 08/21/2017 for Form for work  Comes for a recheck on asthma and vaccine update. He is doing well overall and will be starting a new job. There are no other health issues now. Distant issue with DDD and took therapy, injection and chiropractic treatment. Is doing really well with it now and knows how to protect his back.  Past Medical History:  Diagnosis Date  . Asthma    Relevant past medical, surgical, family and social history reviewed and updated as indicated. Interim medical history since our last visit reviewed. Allergies and medications reviewed and updated. DATA REVIEWED: CHART IN EPIC  Family History reviewed for pertinent findings.  Review of Systems  Constitutional: Negative.  Negative for appetite change and fatigue.  HENT: Negative.   Eyes: Negative.  Negative for pain and visual disturbance.  Respiratory: Negative.  Negative for cough, chest tightness, shortness of breath and wheezing.   Cardiovascular: Negative.  Negative for chest pain, palpitations and leg swelling.  Gastrointestinal: Negative.  Negative for abdominal pain, diarrhea, nausea and vomiting.  Endocrine: Negative.   Genitourinary: Negative.   Musculoskeletal: Negative.   Skin: Negative.  Negative for color change and rash.  Neurological: Negative.  Negative for weakness, numbness and headaches.  Psychiatric/Behavioral: Negative.     Allergies as of 08/21/2017   No Known Allergies     Medication List        Accurate as of 08/21/17 12:33 PM. Always use your most recent med list.          albuterol 108 (90 Base) MCG/ACT inhaler Commonly known as:  PROVENTIL HFA;VENTOLIN HFA Inhale 2 puffs into the lungs every 6 (six) hours as  needed. Reported on 06/30/2015   fluticasone 50 MCG/ACT nasal spray Commonly known as:  FLONASE Place 2 sprays into both nostrils daily.   meloxicam 15 MG tablet Commonly known as:  MOBIC Take 1 tablet (15 mg total) by mouth daily.   pantoprazole 40 MG tablet Commonly known as:  PROTONIX TAKE ONE (1) TABLET EACH DAY   valACYclovir 1000 MG tablet Commonly known as:  VALTREX Take 1 tablet (1,000 mg total) by mouth 2 (two) times daily as needed.          Objective:    BP 129/76   Pulse 68   Temp 98.4 F (36.9 C) (Oral)   Ht 6' 1.5" (1.867 m)   Wt 252 lb 6.4 oz (114.5 kg)   BMI 32.85 kg/m   No Known Allergies  Wt Readings from Last 3 Encounters:  08/21/17 252 lb 6.4 oz (114.5 kg)  05/16/17 253 lb (114.8 kg)  01/31/17 249 lb (112.9 kg)    Physical Exam  Constitutional: He appears well-developed and well-nourished.  HENT:  Head: Normocephalic and atraumatic.  Eyes: Conjunctivae and EOM are normal. Pupils are equal, round, and reactive to light.  Neck: Normal range of motion. Neck supple.  Cardiovascular: Normal rate, regular rhythm and normal heart sounds.  Pulmonary/Chest: Effort normal and breath sounds normal.  Abdominal: Soft. Bowel sounds are normal.  Musculoskeletal: Normal range of motion.  Skin: Skin is warm and  dry.  Nursing note and vitals reviewed.       Assessment & Plan:   1. Mild intermittent asthma, unspecified whether complicated  2. PPD screening test - PPD   Continue all other maintenance medications as listed above.  Follow up plan: No Follow-up on file.  Educational handout given for survey  Remus LofflerAngel S. Zawadi Aplin PA-C [facility] [address]  [place], [place] [address] [phone]   08/21/2017, 12:33 PM

## 2017-08-23 LAB — TB SKIN TEST
Induration: 0 mm
TB SKIN TEST: NEGATIVE

## 2017-11-20 ENCOUNTER — Ambulatory Visit (INDEPENDENT_AMBULATORY_CARE_PROVIDER_SITE_OTHER): Payer: BC Managed Care – PPO | Admitting: Physician Assistant

## 2017-11-20 ENCOUNTER — Encounter: Payer: Self-pay | Admitting: Physician Assistant

## 2017-11-20 VITALS — BP 127/78 | HR 73 | Temp 97.0°F | Ht 73.5 in | Wt 256.0 lb

## 2017-11-20 DIAGNOSIS — J029 Acute pharyngitis, unspecified: Secondary | ICD-10-CM | POA: Diagnosis not present

## 2017-11-20 LAB — RAPID STREP SCREEN (MED CTR MEBANE ONLY): STREP GP A AG, IA W/REFLEX: NEGATIVE

## 2017-11-20 LAB — CULTURE, GROUP A STREP

## 2017-11-20 NOTE — Progress Notes (Signed)
  Subjective:     Patient ID: Cristian Sellers, male   DOB: 02-04-1996, 22 y.o.   MRN: [id number]  HPI Pt with onset of fatigue, tactile fever, and S/T Sx progressive over the last 2 days Has used OTC meds for sx  Review of Systems  Constitutional: Positive for fever. Negative for activity change, appetite change and fatigue.  HENT: Positive for congestion, postnasal drip and sore throat. Negative for ear discharge, ear pain, rhinorrhea, sinus pressure and sinus pain.   Eyes: Negative.   Respiratory: Negative.        Objective:   Physical Exam  Constitutional: He appears well-developed and well-nourished.  Non-toxic appearance. He does not appear ill. No distress.  HENT:  Right Ear: Tympanic membrane and ear canal normal.  Left Ear: Tympanic membrane and ear canal normal.  Mouth/Throat: Uvula is midline. No oral lesions. No uvula swelling. Posterior oropharyngeal erythema present. No oropharyngeal exudate, posterior oropharyngeal edema or tonsillar abscesses.  Neck: Neck supple. No thyromegaly present.  Cardiovascular: Normal rate, regular rhythm and normal heart sounds.  Pulmonary/Chest: Effort normal and breath sounds normal.  Nursing note and vitals reviewed. RST neg     Assessment:     1. Sore throat   2. Pharyngitis, unspecified etiology        Plan:     Fluids Rest Warm salt water gargles OTC antihist/decongest F/U prn

## 2017-11-20 NOTE — Patient Instructions (Signed)

## 2017-11-21 ENCOUNTER — Encounter: Payer: Self-pay | Admitting: Family Medicine

## 2017-11-21 ENCOUNTER — Ambulatory Visit: Payer: BC Managed Care – PPO | Admitting: Family Medicine

## 2017-11-21 VITALS — BP 134/87 | HR 92 | Temp 98.7°F | Ht 73.5 in | Wt 254.2 lb

## 2017-11-21 DIAGNOSIS — J029 Acute pharyngitis, unspecified: Secondary | ICD-10-CM

## 2017-11-21 LAB — RAPID STREP SCREEN (MED CTR MEBANE ONLY): Strep Gp A Ag, IA W/Reflex: NEGATIVE

## 2017-11-21 LAB — CULTURE, GROUP A STREP

## 2017-11-21 MED ORDER — FLUTICASONE PROPIONATE 50 MCG/ACT NA SUSP: 1.0000 | Freq: Two times a day (BID) | NASAL | 6 refills | Status: DC | PRN

## 2017-11-21 MED ORDER — DIPHENHYDRAMINE HCL 25 MG PO TABS: 25.0000 mg | ORAL_TABLET | Freq: Four times a day (QID) | ORAL | 0 refills | Status: DC | PRN

## 2017-11-21 NOTE — Addendum Note (Signed)
Addended by: Almeta Monas on: 11/21/2017 06:34 PM   Modules accepted: Orders

## 2017-11-21 NOTE — Progress Notes (Signed)
BP 134/87   Pulse 92   Temp 98.7 F (37.1 C) (Oral)   Ht 6' 1.5" (1.867 m)   Wt 254 lb 3.2 oz (115.3 kg)   BMI 33.08 kg/m    Subjective:    Patient ID: Cristian Sellers, male    DOB: [birth year], 23 y.o.   MRN: [id number]  HPI: Cristian Sellers is a 22 y.o. male presenting on 11/21/2017 for Sore Throat   HPI Sore throat Patient is coming in for continued sore throat.  He was seen yesterday and diagnosed with unspecified pharyngitis which is likely viral and recommended to do salt water gargles Claritin ibuprofen and is also been using Cepacol.  He has been using those since yesterday but he said he is still been having a sore throat today and may be slightly worse.  He was achy initially 4 days ago but he has not been since.  He says he still been very congested in his nasal region.  He denies any fevers or chills or shortness of breath or wheezing.  Relevant past medical, surgical, family and social history reviewed and updated as indicated. Interim medical history since our last visit reviewed. Allergies and medications reviewed and updated.  Review of Systems  Constitutional: Negative for chills and fever.  HENT: Positive for congestion, postnasal drip, rhinorrhea, sinus pressure, sneezing and sore throat. Negative for ear discharge, ear pain and voice change.   Eyes: Negative for pain, discharge, redness and visual disturbance.  Respiratory: Positive for cough. Negative for shortness of breath and wheezing.   Cardiovascular: Negative for chest pain and leg swelling.  Musculoskeletal: Negative for gait problem.  Skin: Negative for rash.  All other systems reviewed and are negative.   Per HPI unless specifically indicated above   Allergies as of 11/21/2017   No Known Allergies     Medication List        Accurate as of 11/21/17  6:24 PM. Always use your most recent med list.          albuterol 108 (90 Base) MCG/ACT inhaler Commonly known as:  PROVENTIL HFA;VENTOLIN HFA Inhale  2 puffs into the lungs every 6 (six) hours as needed. Reported on 06/30/2015   diphenhydrAMINE 25 MG tablet Commonly known as:  BENADRYL ALLERGY Take 1 tablet (25 mg total) by mouth every 6 (six) hours as needed.   fluticasone 50 MCG/ACT nasal spray Commonly known as:  FLONASE Place 1 spray into both nostrils 2 (two) times daily as needed for allergies or rhinitis.   meloxicam 15 MG tablet Commonly known as:  MOBIC Take 1 tablet (15 mg total) by mouth daily.   pantoprazole 40 MG tablet Commonly known as:  PROTONIX TAKE ONE (1) TABLET EACH DAY   valACYclovir 1000 MG tablet Commonly known as:  VALTREX Take 1 tablet (1,000 mg total) by mouth 2 (two) times daily as needed.          Objective:    BP 134/87   Pulse 92   Temp 98.7 F (37.1 C) (Oral)   Ht 6' 1.5" (1.867 m)   Wt 254 lb 3.2 oz (115.3 kg)   BMI 33.08 kg/m   Wt Readings from Last 3 Encounters:  11/21/17 254 lb 3.2 oz (115.3 kg)  11/20/17 256 lb (116.1 kg)  08/21/17 252 lb 6.4 oz (114.5 kg)    Physical Exam  Constitutional: He is oriented to person, place, and time. He appears well-developed and well-nourished. No distress.  HENT:  Right Ear: Tympanic  membrane, external ear and ear canal normal.  Left Ear: Tympanic membrane, external ear and ear canal normal.  Nose: Mucosal edema and rhinorrhea present. No sinus tenderness. No epistaxis. Right sinus exhibits no maxillary sinus tenderness and no frontal sinus tenderness. Left sinus exhibits no maxillary sinus tenderness and no frontal sinus tenderness.  Mouth/Throat: Uvula is midline and mucous membranes are normal. Posterior oropharyngeal edema present. No oropharyngeal exudate, posterior oropharyngeal erythema or tonsillar abscesses.  Eyes: Pupils are equal, round, and reactive to light. Conjunctivae and EOM are normal. Right eye exhibits no discharge. No scleral icterus.  Neck: Neck supple. No thyromegaly present.  Cardiovascular: Normal rate, regular rhythm,  normal heart sounds and intact distal pulses.  No murmur heard. Pulmonary/Chest: Effort normal and breath sounds normal. No respiratory distress. He has no wheezes. He has no rales.  Musculoskeletal: Normal range of motion. He exhibits no edema.  Lymphadenopathy:    He has no cervical adenopathy.  Neurological: He is alert and oriented to person, place, and time. Coordination normal.  Skin: Skin is warm and dry. No rash noted. He is not diaphoretic.  Psychiatric: He has a normal mood and affect. His behavior is normal.  Nursing note and vitals reviewed.   Rapid strep negative, will send culture but likely viral in nature     Assessment & Plan:   Problem List Items Addressed This Visit    None    Visit Diagnoses    Pharyngitis, unspecified etiology    -  Primary   Relevant Medications   fluticasone (FLONASE) 50 MCG/ACT nasal spray   diphenhydrAMINE (BENADRYL ALLERGY) 25 MG tablet   Other Relevant Orders   Rapid Strep Screen ([facility] ONLY)   Culture, Group A Strep   Rapid Strep Screen ([facility] ONLY)   Upper Respiratory Culture      Recommended Flonase Mucinex and salt water gargles and continue ibuprofen and to give it more time  Follow up plan: Return if symptoms worsen or fail to improve.  Counseling provided for all of the vaccine components Orders Placed This Encounter  Procedures  . Rapid Strep Screen ([facility] ONLY)  . Culture, Group A Strep  . Rapid Strep Screen ([facility] ONLY)  . Upper Respiratory Culture    Arville Care, MD [facility] 11/21/2017, 6:24 PM

## 2017-11-23 ENCOUNTER — Telehealth: Payer: Self-pay | Admitting: Family

## 2017-11-23 ENCOUNTER — Ambulatory Visit: Payer: BC Managed Care – PPO | Admitting: Nurse Practitioner

## 2017-11-23 ENCOUNTER — Telehealth: Payer: Self-pay | Admitting: *Deleted

## 2017-11-23 ENCOUNTER — Encounter: Payer: Self-pay | Admitting: Nurse Practitioner

## 2017-11-23 VITALS — BP 124/81 | HR 82 | Temp 97.1°F | Ht 73.0 in | Wt 244.0 lb

## 2017-11-23 DIAGNOSIS — H6501 Acute serous otitis media, right ear: Secondary | ICD-10-CM

## 2017-11-23 DIAGNOSIS — J029 Acute pharyngitis, unspecified: Secondary | ICD-10-CM | POA: Diagnosis not present

## 2017-11-23 MED ORDER — AMOXICILLIN-POT CLAVULANATE 875-125 MG PO TABS: 1.0000 | ORAL_TABLET | Freq: Two times a day (BID) | ORAL | 0 refills | Status: DC

## 2017-11-23 NOTE — Telephone Encounter (Signed)
Pt aware.

## 2017-11-23 NOTE — Patient Instructions (Signed)
Otitis Media With Effusion, Pediatric Otitis media with effusion (OME) occurs when there is inflammation of the middle ear and fluid in the middle ear space. There are no signs and symptoms of infection. The middle ear space contains air and the bones for hearing. Air in the middle ear space helps to transmit sound to the brain. OME is a common condition in children, and it often occurs after an ear infection. This condition may be present for several weeks or longer after an ear infection. Most cases of this condition get better on their own. What are the causes? OME is caused by a blockage of the eustachian tube in one or both ears. These tubes drain fluid in the ears to the back of the nose (nasopharynx). If the tissue in the tube swells up (edema), the tube closes. This prevents fluid from draining. Blockage can be caused by:  Ear infections.  Colds and other upper respiratory infections.  Allergies.  Irritants, such as tobacco smoke.  Enlarged adenoids. The adenoids are areas of soft tissue located high in the back of the throat, behind the nose and the roof of the mouth. They are part of the body's natural defense (immune) system.  A mass in the nasopharynx.  Damage to the ear caused by pressure changes (barotrauma).  What increases the risk? Your child is more likely to develop this condition if:  He or she has repeated ear and sinus infections.  He or she has allergies.  He or she is exposed to tobacco smoke.  He or she attends daycare.  He or she is not breastfed.  What are the signs or symptoms? Symptoms of this condition may not be obvious. Sometimes this condition does not have any symptoms, or symptoms may overlap with those of a cold or upper respiratory tract illness. Symptoms of this condition include:  Temporary hearing loss.  A feeling of fullness in the ear without pain.  Irritability or agitation.  Balance (vestibular) problems.  As a result of hearing  loss, your child may:  Listen to the TV at a loud volume.  Not respond to questions.  Ask "What?" often when spoken to.  Mistake or confuse one sound or word for another.  Perform poorly at school.  Have a poor attention span.  Become agitated or irritated easily.  How is this diagnosed? This condition is diagnosed with an ear exam. Your child's health care provider will look inside your child's ear with an instrument (otoscope) to check for redness, swelling, and fluid. Other tests may be done, including:  A test to check the movement of the eardrum (pneumatic otoscopy). This is done by squeezing a small amount of air into the ear.  A test that changes air pressure in the middle ear to check how well the eardrum moves and to see if the eustachian tube is working (tympanogram).  Hearing test (audiogram). This test involves playing tones at different pitches to see if your child can hear each tone.  How is this treated? Treatment for this condition depends on the cause. In many cases, the fluid goes away on its own. In some cases, your child may need a procedure to create a hole in the eardrum to allow fluid to drain (myringotomy) and to insert small drainage tubes (tympanostomy tubes) into the eardrums. These tubes help to drain fluid and prevent infection. This procedure may be recommended if:  OME does not get better over several months.  Your child has many ear   infections within several months.  Your child has noticeable hearing loss.  Your child has problems with speech and language development.  Surgery may also be done to remove the adenoids (adenoidectomy). Follow these instructions at home:  Give over-the-counter and prescription medicines only as told by your child's health care provider.  Keep children away from any tobacco smoke.  Keep all follow-up visits as told by your child's health care provider. This is important. How is this prevented?  Keep your  child's vaccinations up to date. Make sure your child gets all recommended vaccinations, including a pneumonia and flu vaccine.  Encourage hand washing. Your child should wash his or her hands often with soap and water. If there is no soap and water, he or she should use hand sanitizer.  Avoid exposing your child to tobacco smoke.  Breastfeed your baby, if possible. Babies who are breastfed as long as possible are less likely to develop this condition. Contact a health care provider if:  Your child's hearing does not get better after 3 months.  Your child's hearing is worse.  Your child has ear pain.  Your child has a fever.  Your child has drainage from the ear.  Your child is dizzy.  Your child has a lump on his or her neck. Get help right away if:  Your child has bleeding from the nose.  Your child cannot move part of her or his face.  Your child has trouble breathing.  Your child cannot smell.  Your child develops severe congestion.  Your child develops weakness.  Your child who is younger than 3 months has a temperature of 100F (38C) or higher. Summary  Otitis media with effusion (OME) occurs when there is inflammation of the middle ear and fluid in the middle ear space.  This condition is caused by blockage of one or both eustachian tubes, which drain fluid in the ears to the back of the nose.  Symptoms of this condition can include temporary hearing loss, a feeling of fullness in the ear, irritability or agitation, and balance (vestibular) problems. Sometimes, there are no symptoms.  This condition is diagnosed with an ear exam and tests, such as pneumatic otoscopy, tympanogram, and audiogram.  Treatment for this condition depends on the cause. In many cases, the fluid goes away on its own. This information is not intended to replace advice given to you by your health care provider. Make sure you discuss any questions you have with your health care  provider. Document Released: 09/09/2003 Document Revised: 05/11/2016 Document Reviewed: 05/11/2016 Elsevier Interactive Patient Education  2017 [employer]  

## 2017-11-23 NOTE — Progress Notes (Signed)
   Subjective:    Patient ID: Cristian Sellers, male    DOB: 10-Nov-1995, 22 y.o.   MRN: [id number]   Chief Complaint: Sore Throat (No better)   HPI Patient come sin today c/o sore throat. He was seen on 11/20/17 by B. Webster with the same complaint. Strep was negative. Was told was viral and treat symptoms. He came back in on 08/24/17 and saw Dr. Ermalinda Memos stating he was no better. Again strep was negative and was told to continue to gargle salt water and was given rx for flonase. Throat is really sore. Hurts to swallow and now the right side of face feels tingly.    Review of Systems  Constitutional: Negative for chills and fever.  HENT: Positive for congestion, rhinorrhea, sore throat and trouble swallowing. Negative for ear pain and voice change.   Respiratory: Negative.  Negative for cough.   Cardiovascular: Negative.   Neurological: Negative.   Psychiatric/Behavioral: Negative.        Objective:   Physical Exam  Constitutional: He is oriented to person, place, and time. He appears well-developed and well-nourished. He appears distressed (mild).  HENT:  Head: Normocephalic.  Right Ear: Hearing and ear canal normal. There is swelling. A middle ear effusion is present.  Left Ear: Hearing, tympanic membrane and ear canal normal.  Mouth/Throat: Mucous membranes are normal. Posterior oropharyngeal erythema present.  Neck: Normal range of motion.  Cardiovascular: Normal rate.  Pulmonary/Chest: Effort normal.  Abdominal: Soft.  Neurological: He is alert and oriented to person, place, and time.  Skin: Skin is warm.  Psychiatric: He has a normal mood and affect.   BP 124/81   Pulse 82   Temp (!) 97.1 F (36.2 C) (Oral)   Ht  (1.854 m)   Wt 244 lb (110.7 kg)   BMI 32.19 kg/m         Assessment & Plan:  Cristian Retherford in today with chief complaint of Sore Throat (No better)   1. Right acute serous otitis media, recurrence not specified Avoid cough if can If eardrumb  ruptures need to let soemone look at it - amoxicillin-clavulanate (AUGMENTIN) 875-125 MG tablet; Take 1 tablet by mouth 2 (two) times daily.  Dispense: 14 tablet; Refill: 0  2. Pharyngitis, unspecified etiology Continue salt gargle Motrin for pain RTO prn   Mary-Margaret Daphine Deutscher, FNP

## 2017-11-23 NOTE — Telephone Encounter (Signed)
Spoke with dad and answered questions about his ear. Will contact with any further needs

## 2017-11-23 NOTE — Telephone Encounter (Signed)
He came in for 2 office visits and 2 days I instructed him at the last visit that if he could wait till Monday or Tuesday to see if this will improve on its own and we would consider it he is also had 2 negative strep test and we have the culture running.  I understand he is uncomfortable but based on the information we have an antibiotic was not warranted

## 2017-11-25 LAB — CULTURE, GROUP A STREP: Strep A Culture: NEGATIVE

## 2017-11-30 ENCOUNTER — Ambulatory Visit: Payer: BC Managed Care – PPO | Admitting: Pediatrics

## 2017-11-30 ENCOUNTER — Encounter: Payer: Self-pay | Admitting: Pediatrics

## 2017-11-30 DIAGNOSIS — H65111 Acute and subacute allergic otitis media (mucoid) (sanguinous) (serous), right ear: Secondary | ICD-10-CM | POA: Diagnosis not present

## 2017-11-30 MED ORDER — AMOXICILLIN-POT CLAVULANATE 875-125 MG PO TABS: 1.0000 | ORAL_TABLET | Freq: Two times a day (BID) | ORAL | 0 refills | Status: DC

## 2017-11-30 NOTE — Progress Notes (Signed)
  Subjective:   Patient ID: Cristian Sellers, male    DOB: 11/08/1995, 22 y.o.   MRN: [id number] CC: Right ear pain. HPI: Cristian Sellers is a 22 y.o. male   Seen in clinic twice last week for sore throat and URI symptoms.  Treated 1 week ago with Augmentin for otitis media.  That night he started having mucus come out of his right ear.  He still has pain in his right ear.  He is finished with antibiotics.  He had fevers at the beginning of the week, does not think he is having fever the last couple days. Appetite has been okay.  He has not been able to hear out of his left ear for the last week.  Relevant past medical, surgical, family and social history reviewed. Allergies and medications reviewed and updated. Social History   Tobacco Use  Smoking Status Never Smoker  Smokeless Tobacco Never Used   ROS: Per HPI   Objective:    BP 121/80   Pulse 62   Temp (!) 97 F (36.1 C) (Oral)   Ht  (1.854 m)   Wt 250 lb (113.4 kg)   BMI 32.98 kg/m   Wt Readings from Last 3 Encounters:  11/30/17 250 lb (113.4 kg)  11/23/17 244 lb (110.7 kg)  11/21/17 254 lb 3.2 oz (115.3 kg)    Gen: NAD, alert, cooperative with exam, NCAT EYES: EOMI, no conjunctival injection, or no icterus ENT: Right TM bulging with yellow fluid, dark scab versus cerumen present base of TM.  Normal left TM.  OP without erythema CV: NRRR, normal S1/S2, no murmur, distal pulses 2+ b/l Resp: CTABL, no wheezes, normal WOB Ext: No edema, warm Neuro: Alert and oriented, strength equal b/l UE and LE, coordination grossly normal MSK: normal muscle bulk  Assessment & Plan:  Diagnoses and all orders for this visit:  Acute mucoid otitis media of right ear Use Claritin regularly, Flonase regularly.  Gave additional prescription for low given ongoing infected appearing effusion of TM.  May take a few weeks for hearing to return to normal.  If not improving needs to be seen.  Will need to see ear nose and throat doctor. -      amoxicillin-clavulanate (AUGMENTIN) 875-125 MG tablet; Take 1 tablet by mouth 2 (two) times daily.   Follow up plan: As needed. Rex Kras, MD [facility]

## 2018-05-21 ENCOUNTER — Other Ambulatory Visit: Payer: Self-pay | Admitting: Family Medicine

## 2018-05-21 ENCOUNTER — Ambulatory Visit: Payer: BC Managed Care – PPO | Admitting: Family Medicine

## 2018-06-17 ENCOUNTER — Encounter: Payer: Self-pay | Admitting: Family

## 2018-06-17 ENCOUNTER — Ambulatory Visit: Payer: BC Managed Care – PPO | Admitting: Family

## 2018-06-17 VITALS — BP 129/77 | HR 63 | Temp 97.8°F | Ht 73.0 in | Wt 263.2 lb

## 2018-06-17 DIAGNOSIS — J4521 Mild intermittent asthma with (acute) exacerbation: Secondary | ICD-10-CM | POA: Diagnosis not present

## 2018-06-17 DIAGNOSIS — J069 Acute upper respiratory infection, unspecified: Secondary | ICD-10-CM

## 2018-06-17 DIAGNOSIS — J45909 Unspecified asthma, uncomplicated: Secondary | ICD-10-CM | POA: Insufficient documentation

## 2018-06-17 MED ORDER — PREDNISONE 10 MG (21) PO TBPK: ORAL_TABLET | ORAL | 0 refills | Status: DC

## 2018-06-17 MED ORDER — AZITHROMYCIN 250 MG PO TABS: ORAL_TABLET | ORAL | 0 refills | Status: DC

## 2018-06-17 MED ORDER — PANTOPRAZOLE SODIUM 40 MG PO TBEC: DELAYED_RELEASE_TABLET | ORAL | 3 refills | Status: DC

## 2018-06-17 NOTE — Patient Instructions (Signed)

## 2018-06-17 NOTE — Progress Notes (Signed)
Subjective:    Patient ID: Cristian Sellers, male    DOB: 10/28/1995, 22 y.o.   MRN: [id number]  Chief Complaint  Patient presents with  . Cough    congestion  . Ear Pain  . Sore Throat    nose plugs upat night    Cough  This is a new problem. The current episode started 1 to 4 weeks ago. The problem has been waxing and waning (was getting better, but over the last 4-5 days it has become worse). The cough is productive of sputum. Associated symptoms include ear congestion, ear pain, myalgias, nasal congestion, postnasal drip, rhinorrhea, a sore throat, shortness of breath and wheezing. Pertinent negatives include no chills, fever or headaches. He has tried rest and OTC cough suppressant for the symptoms. The treatment provided mild relief. His past medical history is significant for asthma. There is no history of COPD.      Review of Systems  Constitutional: Negative for chills and fever.  HENT: Positive for ear pain, postnasal drip, rhinorrhea and sore throat.   Respiratory: Positive for cough, shortness of breath and wheezing.   Musculoskeletal: Positive for myalgias.  Neurological: Negative for headaches.  All other systems reviewed and are negative.      Objective:   Physical Exam Vitals signs reviewed.  Constitutional:      General: He is not in acute distress.    Appearance: He is well-developed.  HENT:     Right Ear: Tympanic membrane is erythematous.     Nose: Congestion and rhinorrhea present.     Mouth/Throat:     Pharynx: Posterior oropharyngeal erythema present.  Eyes:     General:        Right eye: No discharge.        Left eye: No discharge.     Pupils: Pupils are equal, round, and reactive to light.  Neck:     Musculoskeletal: Normal range of motion and neck supple.     Thyroid: No thyromegaly.  Cardiovascular:     Rate and Rhythm: Normal rate and regular rhythm.     Heart sounds: Normal heart sounds. No murmur.  Pulmonary:     Effort: Pulmonary effort  is normal. No respiratory distress.     Breath sounds: Normal breath sounds. No wheezing.     Comments: Intermittent coarse nonproductive cough Abdominal:     General: Bowel sounds are normal. There is no distension.     Palpations: Abdomen is soft.     Tenderness: There is no abdominal tenderness.  Musculoskeletal: Normal range of motion.        General: No tenderness.  Skin:    General: Skin is warm and dry.     Findings: No erythema or rash.  Neurological:     Mental Status: He is alert and oriented to person, place, and time.     Cranial Nerves: No cranial nerve deficit.     Deep Tendon Reflexes: Reflexes are normal and symmetric.  Psychiatric:        Behavior: Behavior normal.        Thought Content: Thought content normal.        Judgment: Judgment normal.     BP 129/77   Pulse 63   Temp 97.8 F (36.6 C) (Oral)   Ht 6\' 1"  (1.854 m)   Wt 263 lb 3.2 oz (119.4 kg)   BMI 34.73 kg/m      Assessment & Plan:  Cristian Sellers comes in today with chief  complaint of Cough (congestion); Ear Pain; and Sore Throat (nose plugs upat night)   Diagnosis and orders addressed:  1. Mild intermittent asthma with acute exacerbation - predniSONE (STERAPRED UNI-PAK 21 TAB) 10 MG (21) TBPK tablet; Use as directed  Dispense: 21 tablet; Refill: 0 - azithromycin (ZITHROMAX) 250 MG tablet; Take 500 mg once, then 250 mg for four days  Dispense: 6 tablet; Refill: 0  2. Viral upper respiratory tract infection  We start prednisone today. If symptoms do not improve or worsen over the next 2-3 days he will start the zpak.  - Take meds as prescribed - Use a cool mist humidifier  -Use saline nose sprays frequently -Force fluids -For any cough or congestion  Use plain Mucinex- regular strength or max strength is fine -For fever or aces or pains- take tylenol or ibuprofen. -Throat lozenges if help RTO if symptoms worsen or do not improve   Jannifer Rodney, FNP

## 2018-09-05 ENCOUNTER — Ambulatory Visit (INDEPENDENT_AMBULATORY_CARE_PROVIDER_SITE_OTHER): Payer: BC Managed Care – PPO | Admitting: Family Medicine

## 2018-09-05 ENCOUNTER — Encounter: Payer: Self-pay | Admitting: Family Medicine

## 2018-09-05 VITALS — BP 134/85 | HR 89 | Temp 98.2°F | Ht 73.0 in | Wt 264.0 lb

## 2018-09-05 DIAGNOSIS — J069 Acute upper respiratory infection, unspecified: Secondary | ICD-10-CM | POA: Diagnosis not present

## 2018-09-05 DIAGNOSIS — J0100 Acute maxillary sinusitis, unspecified: Secondary | ICD-10-CM

## 2018-09-05 MED ORDER — PSEUDOEPH-BROMPHEN-DM 30-2-10 MG/5ML PO SYRP: 5.0000 mL | ORAL_SOLUTION | Freq: Four times a day (QID) | ORAL | 0 refills | Status: DC | PRN

## 2018-09-05 MED ORDER — AMOXICILLIN-POT CLAVULANATE 875-125 MG PO TABS: 1.0000 | ORAL_TABLET | Freq: Two times a day (BID) | ORAL | 0 refills | Status: AC

## 2018-09-05 NOTE — Progress Notes (Signed)
    Subjective:     Cristian Sellers is a 23 y.o. male who presents for evaluation of symptoms of a URI, possible sinusitis. Symptoms include bilateral ear pressure/pain, congestion, coryza, cough described as nonproductive, facial pain, headache described as pressure, low grade fever, nasal congestion, post nasal drip and sinus pressure. Onset of symptoms was 3 weeks ago, and has been gradually worsening since that time. Treatment to date: none.  The following portions of the patient's history were reviewed and updated as appropriate: allergies, current medications, past family history, past medical history, past social history, past surgical history and problem list.  Review of Systems Pertinent items noted in HPI and remainder of comprehensive ROS otherwise negative.   Objective:    BP 134/85   Pulse 89   Temp 98.2 F (36.8 C) (Oral)   Ht 6\' 1"  (1.854 m)   Wt 264 lb (119.7 kg)   BMI 34.83 kg/m  General appearance: alert, cooperative and mild distress Head: Normocephalic, without obvious abnormality, atraumatic Eyes: negative Ears: abnormal TM right ear - serous middle ear fluid and abnormal TM left ear - serous middle ear fluid Nose: scant and yellow discharge, moderate congestion, turbinates red, swollen, moderate maxillary sinus tenderness bilateral Throat: abnormal findings: mild oropharyngeal erythema Neck: no adenopathy, no carotid bruit, no JVD, supple, symmetrical, trachea midline and thyroid not enlarged, symmetric, no tenderness/mass/nodules Lungs: clear to auscultation bilaterally Heart: regular rate and rhythm, S1, S2 normal, no murmur, click, rub or gallop Skin: Skin color, texture, turgor normal. No rashes or lesions Neurologic: Grossly normal   Assessment:     Cristian was seen today for uri.  Diagnoses and all orders for this visit:  Acute non-recurrent maxillary sinusitis Due to ongoing symptoms, will treat with antibiotics. Symptomatic care discussed. Report any  new or worsening symptoms. Medications as prescribed.  -     amoxicillin-clavulanate (AUGMENTIN) 875-125 MG tablet; Take 1 tablet by mouth 2 (two) times daily for 10 days. -     brompheniramine-pseudoephedrine-DM 30-2-10 MG/5ML syrup; Take 5 mLs by mouth 4 (four) times daily as needed.  URI with cough and congestion Symptomatic care discussed. Report any new or worsening symptoms. Medications as prescribed.  -     brompheniramine-pseudoephedrine-DM 30-2-10 MG/5ML syrup; Take 5 mLs by mouth 4 (four) times daily as needed.     Plan:    Discussed diagnosis and treatment of URI. Discussed the diagnosis and treatment of sinusitis. Suggested symptomatic OTC remedies. Nasal saline spray for congestion. Augmentin per orders. Follow up as needed. Call in 3 days if symptoms aren't resolving.    The above assessment and management plan was discussed with the patient. The patient verbalized understanding of and has agreed to the management plan. Patient is aware to call the clinic if symptoms fail to improve or worsen. Patient is aware when to return to the clinic for a follow-up visit. Patient educated on when it is appropriate to go to the emergency department.   Kari Baars, FNP-C [facility] [address] [place], [place] [address] [phone]

## 2018-09-05 NOTE — Patient Instructions (Signed)
Sinusitis, Adult  Sinusitis is inflammation of your sinuses. Sinuses are hollow spaces in the bones around your face. Your sinuses are located:   Around your eyes.   In the middle of your forehead.   Behind your nose.   In your cheekbones.  Mucus normally drains out of your sinuses. When your nasal tissues become inflamed or swollen, mucus can become trapped or blocked. This allows bacteria, viruses, and fungi to grow, which leads to infection. Most infections of the sinuses are caused by a virus.  Sinusitis can develop quickly. It can last for up to 4 weeks (acute) or for more than 12 weeks (chronic). Sinusitis often develops after a cold.  What are the causes?  This condition is caused by anything that creates swelling in the sinuses or stops mucus from draining. This includes:   Allergies.   Asthma.   Infection from bacteria or viruses.   Deformities or blockages in your nose or sinuses.   Abnormal growths in the nose (nasal polyps).   Pollutants, such as chemicals or irritants in the air.   Infection from fungi (rare).  What increases the risk?  You are more likely to develop this condition if you:   Have a weak body defense system (immune system).   Do a lot of swimming or diving.   Overuse nasal sprays.   Smoke.  What are the signs or symptoms?  The main symptoms of this condition are pain and a feeling of pressure around the affected sinuses. Other symptoms include:   Stuffy nose or congestion.   Thick drainage from your nose.   Swelling and warmth over the affected sinuses.   Headache.   Upper toothache.   A cough that may get worse at night.   Extra mucus that collects in the throat or the back of the nose (postnasal drip).   Decreased sense of smell and taste.   Fatigue.   A fever.   Sore throat.   Bad breath.  How is this diagnosed?  This condition is diagnosed based on:   Your symptoms.   Your medical history.   A physical exam.   Tests to find out if your condition is  acute or chronic. This may include:  ? Checking your nose for nasal polyps.  ? Viewing your sinuses using a device that has a light (endoscope).  ? Testing for allergies or bacteria.  ? Imaging tests, such as an MRI or CT scan.  In rare cases, a bone biopsy may be done to rule out more serious types of fungal sinus disease.  How is this treated?  Treatment for sinusitis depends on the cause and whether your condition is chronic or acute.   If caused by a virus, your symptoms should go away on their own within 10 days. You may be given medicines to relieve symptoms. They include:  ? Medicines that shrink swollen nasal passages (topical intranasal decongestants).  ? Medicines that treat allergies (antihistamines).  ? A spray that eases inflammation of the nostrils (topical intranasal corticosteroids).  ? Rinses that help get rid of thick mucus in your nose (nasal saline washes).   If caused by bacteria, your health care provider may recommend waiting to see if your symptoms improve. Most bacterial infections will get better without antibiotic medicine. You may be given antibiotics if you have:  ? A severe infection.  ? A weak immune system.   If caused by narrow nasal passages or nasal polyps, you may need   to have surgery.  Follow these instructions at home:  Medicines   Take, use, or apply over-the-counter and prescription medicines only as told by your health care provider. These may include nasal sprays.   If you were prescribed an antibiotic medicine, take it as told by your health care provider. Do not stop taking the antibiotic even if you start to feel better.  Hydrate and humidify     Drink enough fluid to keep your urine pale yellow. Staying hydrated will help to thin your mucus.   Use a cool mist humidifier to keep the humidity level in your home above 50%.   Inhale steam for 10-15 minutes, 3-4 times a day, or as told by your health care provider. You can do this in the bathroom while a hot shower is  running.   Limit your exposure to cool or dry air.  Rest   Rest as much as possible.   Sleep with your head raised (elevated).   Make sure you get enough sleep each night.  General instructions     Apply a warm, moist washcloth to your face 3-4 times a day or as told by your health care provider. This will help with discomfort.   Wash your hands often with soap and water to reduce your exposure to germs. If soap and water are not available, use hand sanitizer.   Do not smoke. Avoid being around people who are smoking (secondhand smoke).   Keep all follow-up visits as told by your health care provider. This is important.  Contact a health care provider if:   You have a fever.   Your symptoms get worse.   Your symptoms do not improve within 10 days.  Get help right away if:   You have a severe headache.   You have persistent vomiting.   You have severe pain or swelling around your face or eyes.   You have vision problems.   You develop confusion.   Your neck is stiff.   You have trouble breathing.  Summary   Sinusitis is soreness and inflammation of your sinuses. Sinuses are hollow spaces in the bones around your face.   This condition is caused by nasal tissues that become inflamed or swollen. The swelling traps or blocks the flow of mucus. This allows bacteria, viruses, and fungi to grow, which leads to infection.   If you were prescribed an antibiotic medicine, take it as told by your health care provider. Do not stop taking the antibiotic even if you start to feel better.   Keep all follow-up visits as told by your health care provider. This is important.  This information is not intended to replace advice given to you by your health care provider. Make sure you discuss any questions you have with your health care provider.  Document Released: 06/19/2005 Document Revised: 11/19/2017 Document Reviewed: 11/19/2017  Elsevier Interactive Patient Education  2019 [employer]

## 2018-09-19 ENCOUNTER — Other Ambulatory Visit: Payer: Self-pay | Admitting: Family

## 2018-12-09 ENCOUNTER — Encounter: Payer: Self-pay | Admitting: Family Medicine

## 2018-12-09 ENCOUNTER — Other Ambulatory Visit: Payer: Self-pay

## 2018-12-09 ENCOUNTER — Other Ambulatory Visit: Payer: 59

## 2018-12-09 ENCOUNTER — Ambulatory Visit (INDEPENDENT_AMBULATORY_CARE_PROVIDER_SITE_OTHER): Payer: BC Managed Care – PPO | Admitting: Family Medicine

## 2018-12-09 ENCOUNTER — Other Ambulatory Visit: Payer: Self-pay | Admitting: Internal Medicine

## 2018-12-09 ENCOUNTER — Telehealth: Payer: Self-pay | Admitting: General Practice

## 2018-12-09 DIAGNOSIS — J069 Acute upper respiratory infection, unspecified: Secondary | ICD-10-CM

## 2018-12-09 DIAGNOSIS — Z20822 Contact with and (suspected) exposure to covid-19: Secondary | ICD-10-CM

## 2018-12-09 NOTE — Progress Notes (Signed)
Virtual Visit via telephone Note  I connected with Cristian Sellers on 12/09/18 at 1133 by telephone and verified that I am speaking with the correct person using two identifiers. Cristian Bachtell is currently located at home and girlfriend Cristian Sellers are currently with her during visit. The provider, Fransisca Kaufmann Dettinger, MD is located in their office at time of visit.  Call ended at 1144  I discussed the limitations, risks, security and privacy concerns of performing an evaluation and management service by telephone and the availability of in person appointments. I also discussed with the patient that there may be a patient responsible charge related to this service. The patient expressed understanding and agreed to proceed.   History and Present Illness: Patient awoke this am with sore throat and ear pressure and headache.  Patient denies any fevers or chills.  He does have nasal drainage and girlfriend and her have been ill.  Girlfriend is going for testing. Patient has not tried anything over the counter for it yet.  He denies any short of breath or wheezing.   No diagnosis found.  Outpatient Encounter Medications as of 12/09/2018  Medication Sig  . brompheniramine-pseudoephedrine-DM 30-2-10 MG/5ML syrup Take 5 mLs by mouth 4 (four) times daily as needed.  . fluticasone (FLONASE) 50 MCG/ACT nasal spray Place 1 spray into both nostrils 2 (two) times daily as needed for allergies or rhinitis. (Patient not taking: Reported on 09/05/2018)  . pantoprazole (PROTONIX) 40 MG tablet TAKE ONE (1) TABLET EACH DAY  . valACYclovir (VALTREX) 1000 MG tablet Take 1 tablet (1,000 mg total) by mouth 2 (two) times daily as needed. (Patient not taking: Reported on 06/17/2018)  . VENTOLIN HFA 108 (90 Base) MCG/ACT inhaler USE 2 PUFFS EVERY 6 HOURS AS NEEDED   No facility-administered encounter medications on file as of 12/09/2018.     Review of Systems  Constitutional: Negative for chills and fever.  HENT: Positive  for congestion, ear pain, postnasal drip, rhinorrhea, sinus pressure, sneezing and sore throat. Negative for ear discharge and voice change.   Eyes: Negative for pain, discharge, redness and visual disturbance.  Respiratory: Positive for cough. Negative for shortness of breath and wheezing.   Cardiovascular: Negative for chest pain and leg swelling.  Musculoskeletal: Negative for gait problem.  Skin: Negative for rash.  All other systems reviewed and are negative.   Observations/Objective: Patient sounds comfortable and in no acute distress  Assessment and Plan: Problem List Items Addressed This Visit    None    Visit Diagnoses    Viral upper respiratory illness    -  Primary   Relevant Orders   MyChart Temperature FLOWSHEET       Follow Up Instructions: Will send for covid testing   recommend self quarantine  I discussed the assessment and treatment plan with the patient. The patient was provided an opportunity to ask questions and all were answered. The patient agreed with the plan and demonstrated an understanding of the instructions.   The patient was advised to call back or seek an in-person evaluation if the symptoms worsen or if the condition fails to improve as anticipated.  The above assessment and management plan was discussed with the patient. The patient verbalized understanding of and has agreed to the management plan. Patient is aware to call the clinic if symptoms persist or worsen. Patient is aware when to return to the clinic for a follow-up visit. Patient educated on when it is appropriate to go to the emergency  department.    I provided 11 minutes of non-face-to-face time during this encounter.    Nils Pyle, MD

## 2018-12-09 NOTE — Addendum Note (Signed)
Addended by: Dimple Nanas on: 12/09/2018 12:32 PM   Modules accepted: Orders

## 2018-12-09 NOTE — Telephone Encounter (Signed)
-----   Message from Worthy Rancher, MD sent at 12/09/2018 11:45 AM EDT ----- covid testing

## 2018-12-09 NOTE — Telephone Encounter (Signed)
Pt has been scheduled Covid-19 testing. Pt was scheduled with girl friend Darrick Penna, she is also having covid testing    Referred by : Dettinger, Fransisca Kaufmann, MD

## 2018-12-13 ENCOUNTER — Telehealth: Payer: Self-pay | Admitting: *Deleted

## 2018-12-13 LAB — NOVEL CORONAVIRUS, NAA: SARS-CoV-2, NAA: NOT DETECTED

## 2018-12-13 NOTE — Telephone Encounter (Signed)
Pt called to get results of COVID test on 12/09/2018; explained to pt that can 2-3 business days for results, and this could be delayed based on the number of tests that have to be completed; he verbalized understanding.

## 2018-12-14 ENCOUNTER — Telehealth: Payer: Self-pay | Admitting: Family Medicine

## 2018-12-14 NOTE — Telephone Encounter (Signed)
**  [place] After Hours/ Emergency Line Call**  Patient: Cristian Sellers.  PCP: Sharion Balloon, FNP  Patient calling to request updates on COVID19 test from Monday.  I see an active order in the chart but no pending result.  I have instructed him to call office on Monday to check in on status. Will forward to PCP.  Senia Even M. Lajuana Ripple, DO

## 2018-12-16 ENCOUNTER — Encounter: Payer: Self-pay | Admitting: *Deleted

## 2018-12-16 ENCOUNTER — Telehealth: Payer: Self-pay

## 2018-12-16 ENCOUNTER — Telehealth: Payer: Self-pay | Admitting: Family

## 2018-12-16 NOTE — Telephone Encounter (Signed)
Copied from [place] [id number]. Topic: General - Inquiry >> Dec 16, 2018  8:23 AM Richardo Priest, NT wrote: Reason for CRM: Patient calling in checking on status of COVID results.

## 2018-12-16 NOTE — Telephone Encounter (Signed)
Letter written and pt aware  

## 2018-12-16 NOTE — Telephone Encounter (Signed)
PT is wanting to know results of his COVID test

## 2018-12-16 NOTE — Telephone Encounter (Signed)
If it is truly negative he can return to work if he is not having symptoms of respiratory symptoms, if he is still having respiratory symptoms he cannot return to work tomorrow, if he has no respiratory symptoms then I am fine with going ahead and doing that note.  If his test truly has come back negative on the [facility] note.

## 2018-12-16 NOTE — Telephone Encounter (Signed)
Pt [facility] report says COVID - not detected . He would like a work note to return tomorrow 12/17/18.  Janett Billow is trying to print [facility] note)

## 2018-12-16 NOTE — Telephone Encounter (Signed)
COVID is not resulted yet.

## 2018-12-16 NOTE — Telephone Encounter (Signed)
Pt has a print out from [place] is NEG - needs updated note for work to return today as possible

## 2018-12-16 NOTE — Telephone Encounter (Signed)
Sounds good, go ahead and put a note that he can return as soon as symptoms are gone

## 2018-12-16 NOTE — Telephone Encounter (Signed)
Reason for CRM: Patient calling in checking on status of COVID results.

## 2018-12-17 LAB — NOVEL CORONAVIRUS, NAA: SARS-CoV-2, NAA: NOT DETECTED

## 2018-12-17 NOTE — Telephone Encounter (Signed)
Patient called to advise a copy of his result will be mailed, he says someone already called to say that and they verified his address.

## 2018-12-17 NOTE — Telephone Encounter (Signed)
Patient called to advise patient the results have not resulted, he says he called [facility] yesterday morning and they gave him the results of not detected. He says [employer] told him it was reported in on Friday and somehow it's not going into the chart. He says he received an email.

## 2018-12-19 ENCOUNTER — Other Ambulatory Visit: Payer: Self-pay | Admitting: Family

## 2018-12-19 MED ORDER — PANTOPRAZOLE SODIUM 40 MG PO TBEC: DELAYED_RELEASE_TABLET | ORAL | 3 refills | Status: DC

## 2018-12-19 NOTE — Telephone Encounter (Signed)
What is the name of the medication? pantoprazole (PROTONIX) 40 MG tablet    Have you contacted your pharmacy to request a refill? YES  Which pharmacy would you like this sent to? [facility] [place]   Patient notified that their request is being sent to the clinical staff for review and that they should receive a call once it is complete. If they do not receive a call within 24 hours they can check with their pharmacy or our office.   ;

## 2019-02-13 ENCOUNTER — Telehealth: Payer: Self-pay | Admitting: Family

## 2019-02-13 NOTE — Telephone Encounter (Signed)
done

## 2019-02-25 ENCOUNTER — Telehealth: Payer: Self-pay | Admitting: *Deleted

## 2019-02-26 ENCOUNTER — Other Ambulatory Visit: Payer: Self-pay | Admitting: Family

## 2019-02-26 NOTE — Telephone Encounter (Signed)
error 

## 2019-03-17 ENCOUNTER — Other Ambulatory Visit: Payer: Self-pay | Admitting: *Deleted

## 2019-03-17 DIAGNOSIS — Z20822 Contact with and (suspected) exposure to covid-19: Secondary | ICD-10-CM

## 2019-03-18 LAB — NOVEL CORONAVIRUS, NAA: SARS-CoV-2, NAA: NOT DETECTED

## 2019-04-25 ENCOUNTER — Other Ambulatory Visit: Payer: Self-pay

## 2019-04-25 DIAGNOSIS — Z20822 Contact with and (suspected) exposure to covid-19: Secondary | ICD-10-CM

## 2019-04-27 LAB — NOVEL CORONAVIRUS, NAA: SARS-CoV-2, NAA: NOT DETECTED

## 2019-04-29 ENCOUNTER — Other Ambulatory Visit: Payer: Self-pay | Admitting: *Deleted

## 2019-04-29 DIAGNOSIS — Z20822 Contact with and (suspected) exposure to covid-19: Secondary | ICD-10-CM

## 2019-05-01 LAB — NOVEL CORONAVIRUS, NAA: SARS-CoV-2, NAA: NOT DETECTED

## 2019-05-20 ENCOUNTER — Other Ambulatory Visit: Payer: Self-pay

## 2019-05-20 ENCOUNTER — Ambulatory Visit (INDEPENDENT_AMBULATORY_CARE_PROVIDER_SITE_OTHER): Payer: BC Managed Care – PPO | Admitting: Family Medicine

## 2019-05-20 ENCOUNTER — Encounter: Payer: Self-pay | Admitting: Family Medicine

## 2019-05-20 DIAGNOSIS — J0100 Acute maxillary sinusitis, unspecified: Secondary | ICD-10-CM | POA: Diagnosis not present

## 2019-05-20 MED ORDER — AMOXICILLIN-POT CLAVULANATE 875-125 MG PO TABS: 1.0000 | ORAL_TABLET | Freq: Two times a day (BID) | ORAL | 0 refills | Status: DC

## 2019-05-20 NOTE — Progress Notes (Signed)
Virtual Visit via telephone Note Due to COVID-19 pandemic this visit was conducted virtually. This visit type was conducted due to national recommendations for restrictions regarding the COVID-19 Pandemic (e.g. social distancing, sheltering in place) in an effort to limit this patient's exposure and mitigate transmission in our community. All issues noted in this document were discussed and addressed.  A physical exam was not performed with this format.  I connected with Cristian Sellers on 05/20/19 at 4:20 by telephone and verified that I am speaking with the correct person using two identifiers. Cristian Sellers is currently located at home and no one is currently with him during visit. The provider, Mary-Margaret Hassell Done, FNP is located in their office at time of visit.  I discussed the limitations, risks, security and privacy concerns of performing an evaluation and management service by telephone and the availability of in person appointments. I also discussed with the patient that there may be a patient responsible charge related to this service. The patient expressed understanding and agreed to proceed.   History and Present Illness:   Chief Complaint: URI   HPI Patient calls in c/o cough and congestion with lots of facial pressure. Started Friday and is worsening daily. Denies fever. He was tested for codid on Friday which was negative.    Review of Systems  Constitutional: Negative for chills and fever.  HENT: Positive for congestion and sinus pain.   Respiratory: Positive for cough.   Cardiovascular: Negative.   Musculoskeletal: Negative for myalgias.  Neurological: Positive for headaches.  Psychiatric/Behavioral: Negative.      Observations/Objective: Alert and oriented- answers all questions appropriately mild distress    Assessment and Plan: Cristian Sellers in today with chief complaint of URI   1. Acute maxillary sinusitis, recurrence not specified 1. Take meds as  prescribed 2. Use a cool mist humidifier especially during the winter months and when heat has been humid. 3. Use saline nose sprays frequently 4. Saline irrigations of the nose can be very helpful if done frequently.  * 4X daily for 1 week*  * Use of a nettie pot can be helpful with this. Follow directions with this* 5. Drink plenty of fluids 6. Keep thermostat turn down low 7.For any cough or congestion  Use plain Mucinex- regular strength or max strength is fine   * Children- consult with Pharmacist for dosing 8. For fever or aces or pains- take tylenol or ibuprofen appropriate for age and weight.  * for fevers greater than 101 orally you may alternate ibuprofen and tylenol every  3 hours.   Meds ordered this encounter  Medications  . amoxicillin-clavulanate (AUGMENTIN) 875-125 MG tablet    Sig: Take 1 tablet by mouth 2 (two) times daily.    Dispense:  14 tablet    Refill:  0    Order Specific Question:   Supervising Provider    Answer:   Caryl Pina A [[id number]]      Follow Up Instructions: prn    I discussed the assessment and treatment plan with the patient. The patient was provided an opportunity to ask questions and all were answered. The patient agreed with the plan and demonstrated an understanding of the instructions.   The patient was advised to call back or seek an in-person evaluation if the symptoms worsen or if the condition fails to improve as anticipated.  The above assessment and management plan was discussed with the patient. The patient verbalized understanding of and has agreed to the management plan.  Patient is aware to call the clinic if symptoms persist or worsen. Patient is aware when to return to the clinic for a follow-up visit. Patient educated on when it is appropriate to go to the emergency department.   Time call ended:  4:34 I provided 15 minutes of non-face-to-face time during this encounter.    Mary-Margaret Daphine Deutscher, FNP

## 2019-08-07 ENCOUNTER — Other Ambulatory Visit: Payer: Self-pay | Admitting: Family Medicine

## 2019-09-05 ENCOUNTER — Other Ambulatory Visit: Payer: Self-pay

## 2019-09-05 ENCOUNTER — Ambulatory Visit: Payer: 59 | Attending: Internal Medicine

## 2019-09-05 DIAGNOSIS — Z20822 Contact with and (suspected) exposure to covid-19: Secondary | ICD-10-CM

## 2019-09-06 LAB — NOVEL CORONAVIRUS, NAA: SARS-CoV-2, NAA: NOT DETECTED

## 2019-09-22 ENCOUNTER — Encounter: Payer: Self-pay | Admitting: Family Medicine

## 2019-09-22 ENCOUNTER — Telehealth (INDEPENDENT_AMBULATORY_CARE_PROVIDER_SITE_OTHER): Payer: BC Managed Care – PPO | Admitting: Family Medicine

## 2019-09-22 DIAGNOSIS — K2900 Acute gastritis without bleeding: Secondary | ICD-10-CM | POA: Diagnosis not present

## 2019-09-22 DIAGNOSIS — R1033 Periumbilical pain: Secondary | ICD-10-CM | POA: Diagnosis not present

## 2019-09-22 MED ORDER — PANTOPRAZOLE SODIUM 40 MG PO TBEC: 40.0000 mg | DELAYED_RELEASE_TABLET | Freq: Two times a day (BID) | ORAL | 3 refills | Status: DC

## 2019-09-22 NOTE — Progress Notes (Signed)
Subjective:    Patient ID: Cristian Sellers, male    DOB: Jun 20, 1996, 24 y.o.   MRN: [id number]   HPI: Cristian Moll is a 24 y.o. male presenting for three weeks of increasing epigastric pain described as sharp. Multiple episodes of vomiting. Concerned for Gall bladder. He does feel bloated as well. No back or shoulder pain reported. He also has some at the level of his waistline at the midline as well. He has been afraid to eat as a result. He had grilled chicken last night and pain recurred along with vomiting and bloating. Just had peanut butter crackers for lunch. Holding them down so far.    Depression screen [facility] 2/9 06/17/2018 11/30/2017 11/23/2017 11/21/2017 08/21/2017  Decreased Interest 0 0 0 - 2  Down, Depressed, Hopeless 0 0 0 0 0  PHQ - 2 Score 0 0 0 0 2  Altered sleeping - - - - 3  Tired, decreased energy - - - - 2  Change in appetite - - - - 0  Feeling bad or failure about yourself  - - - - 0  Trouble concentrating - - - - 3  Moving slowly or fidgety/restless - - - - 0  Suicidal thoughts - - - - 0  PHQ-9 Score - - - - 10     Relevant past medical, surgical, family and social history reviewed and updated as indicated.  Interim medical history since our last visit reviewed. Allergies and medications reviewed and updated.  ROS:  Review of Systems  Constitutional: Negative for chills, diaphoresis, fever and unexpected weight change.  HENT: Negative for rhinorrhea and trouble swallowing.   Respiratory: Negative for cough, chest tightness and shortness of breath.   Cardiovascular: Negative for chest pain.  Gastrointestinal: Positive for abdominal pain. Negative for abdominal distention, blood in stool, constipation, diarrhea, nausea, rectal pain and vomiting.  Genitourinary: Negative for dysuria, flank pain and hematuria.  Musculoskeletal: Negative for arthralgias and joint swelling.  Skin: Negative for rash.  Neurological: Negative for syncope and headaches.     Social  History   Tobacco Use  Smoking Status Never Smoker  Smokeless Tobacco Never Used       Objective:     Wt Readings from Last 3 Encounters:  09/05/18 264 lb (119.7 kg)  06/17/18 263 lb 3.2 oz (119.4 kg)  11/30/17 250 lb (113.4 kg)     Exam deferred. Pt. Harboring due to COVID 19. Phone visit performed.   Assessment & Plan:   1. Acute gastritis, presence of bleeding unspecified, unspecified gastritis type   2. Periumbilical abdominal pain     Meds ordered this encounter  Medications  . pantoprazole (PROTONIX) 40 MG tablet    Sig: Take 1 tablet (40 mg total) by mouth 2 (two) times daily. TAKE ONE (1) TABLET EACH DAY    Dispense:  180 tablet    Refill:  3    Orders Placed This Encounter  Procedures  . US Abdomen Complete    Standing Status:   Future    Standing Expiration Date:   11/22/2020    Order Specific Question:   Reason for Exam (SYMPTOM  OR DIAGNOSIS REQUIRED)    Answer:   diffuse pain    Order Specific Question:   Preferred imaging location?    Answer:   Internal      Diagnoses and all orders for this visit:  Acute gastritis, presence of bleeding unspecified, unspecified gastritis type -     US Abdomen  Complete; Future  Periumbilical abdominal pain -     US Abdomen Complete; Future  Other orders -     pantoprazole (PROTONIX) 40 MG tablet; Take 1 tablet (40 mg total) by mouth 2 (two) times daily. TAKE ONE (1) TABLET EACH DAY    Virtual Visit via telephone Note  I discussed the limitations, risks, security and privacy concerns of performing an evaluation and management service by telephone and the availability of in person appointments. The patient was identified with two identifiers. Pt.expressed understanding and agreed to proceed. Pt. Is at home. Dr. Livia Snellen is in his office.  Follow Up Instructions:   I discussed the assessment and treatment plan with the patient. The patient was provided an opportunity to ask questions and all were answered. The  patient agreed with the plan and demonstrated an understanding of the instructions.   The patient was advised to call back or seek an in-person evaluation if the symptoms worsen or if the condition fails to improve as anticipated.   Total minutes including chart review and phone contact time: 19   Follow up plan: Return in about 2 weeks (around 10/06/2019), or if symptoms worsen or fail to improve.  Claretta Fraise, MD [place]

## 2019-09-23 ENCOUNTER — Ambulatory Visit (HOSPITAL_COMMUNITY)
Admission: RE | Admit: 2019-09-23 | Discharge: 2019-09-23 | Disposition: A | Discharge status: Home / Self Care | Payer: BC Managed Care – PPO | Source: Ambulatory Visit | Attending: Family Medicine | Admitting: Family Medicine

## 2019-09-23 ENCOUNTER — Other Ambulatory Visit: Payer: Self-pay

## 2019-09-23 ENCOUNTER — Other Ambulatory Visit: Payer: Self-pay | Admitting: Family Medicine

## 2019-09-23 DIAGNOSIS — R1033 Periumbilical pain: Secondary | ICD-10-CM | POA: Diagnosis present

## 2019-09-23 DIAGNOSIS — K2900 Acute gastritis without bleeding: Secondary | ICD-10-CM | POA: Diagnosis present

## 2019-09-23 DIAGNOSIS — R160 Hepatomegaly, not elsewhere classified: Secondary | ICD-10-CM

## 2019-09-23 DIAGNOSIS — R1011 Right upper quadrant pain: Secondary | ICD-10-CM

## 2019-09-23 NOTE — Addendum Note (Signed)
Addended by: Mechele Claude on: 09/23/2019 11:49 AM   Modules accepted: Orders

## 2019-09-24 ENCOUNTER — Other Ambulatory Visit: Payer: BC Managed Care – PPO

## 2019-09-24 ENCOUNTER — Other Ambulatory Visit: Payer: Self-pay | Admitting: Family Medicine

## 2019-09-24 DIAGNOSIS — R1011 Right upper quadrant pain: Secondary | ICD-10-CM

## 2019-09-24 DIAGNOSIS — R1033 Periumbilical pain: Secondary | ICD-10-CM

## 2019-09-25 LAB — CMP14+EGFR
ALT: 59 IU/L — ABNORMAL HIGH (ref 0–44)
AST: 30 IU/L (ref 0–40)
Albumin/Globulin Ratio: 2 (ref 1.2–2.2)
Albumin: 4.7 g/dL (ref 4.1–5.2)
Alkaline Phosphatase: 61 IU/L (ref 39–117)
BUN/Creatinine Ratio: 10 (ref 9–20)
BUN: 13 mg/dL (ref 6–20)
Bilirubin Total: 1.2 mg/dL (ref 0.0–1.2)
CO2: 24 mmol/L (ref 20–29)
Calcium: 9.5 mg/dL (ref 8.7–10.2)
Chloride: 103 mmol/L (ref 96–106)
Creatinine, Ser: 1.28 mg/dL — ABNORMAL HIGH (ref 0.76–1.27)
GFR calc Af Amer: 91 mL/min/{1.73_m2} (ref >59)
GFR calc non Af Amer: 78 mL/min/{1.73_m2} (ref >59)
Globulin, Total: 2.4 g/dL (ref 1.5–4.5)
Glucose: 83 mg/dL (ref 65–99)
Potassium: 4.2 mmol/L (ref 3.5–5.2)
Sodium: 141 mmol/L (ref 134–144)
Total Protein: 7.1 g/dL (ref 6.0–8.5)

## 2019-09-25 LAB — HEPATITIS PANEL, ACUTE
Hep A IgM: NEGATIVE
Hep B C IgM: NEGATIVE
Hep C Virus Ab: <0.1 s/co ratio (ref 0.0–0.9)
Hepatitis B Surface Ag: NEGATIVE

## 2019-09-25 LAB — CBC WITH DIFFERENTIAL/PLATELET
Basophils Absolute: 0.1 10*3/uL (ref 0.0–0.2)
Basos: 1 %
EOS (ABSOLUTE): 0.2 10*3/uL (ref 0.0–0.4)
Eos: 2 %
Hematocrit: 46.5 % (ref 37.5–51.0)
Hemoglobin: 15.7 g/dL (ref 13.0–17.7)
Immature Grans (Abs): 0 10*3/uL (ref 0.0–0.1)
Immature Granulocytes: 0 %
Lymphocytes Absolute: 2.3 10*3/uL (ref 0.7–3.1)
Lymphs: 29 %
MCH: 29.5 pg (ref 26.6–33.0)
MCHC: 33.8 g/dL (ref 31.5–35.7)
MCV: 87 fL (ref 79–97)
Monocytes Absolute: 0.5 10*3/uL (ref 0.1–0.9)
Monocytes: 6 %
Neutrophils Absolute: 5 10*3/uL (ref 1.4–7.0)
Neutrophils: 62 %
Platelets: 332 10*3/uL (ref 150–450)
RBC: 5.33 x10E6/uL (ref 4.14–5.80)
RDW: 12.3 % (ref 11.6–15.4)
WBC: 8 10*3/uL (ref 3.4–10.8)

## 2019-09-29 LAB — EPSTEIN-BARR VIRUS (EBV) ANTIBODY PROFILE
EBV NA IgG: <18 U/mL (ref 0.0–17.9)
EBV VCA IgG: >600 U/mL — ABNORMAL HIGH (ref 0.0–17.9)
EBV VCA IgM: <36 U/mL (ref 0.0–35.9)

## 2019-09-29 LAB — SPECIMEN STATUS REPORT

## 2019-09-30 ENCOUNTER — Telehealth: Payer: Self-pay | Admitting: *Deleted

## 2019-09-30 ENCOUNTER — Other Ambulatory Visit: Payer: Self-pay | Admitting: Nurse Practitioner

## 2019-09-30 DIAGNOSIS — R1011 Right upper quadrant pain: Secondary | ICD-10-CM

## 2019-09-30 NOTE — Progress Notes (Signed)
ct 

## 2019-09-30 NOTE — Telephone Encounter (Signed)
Pt is wanting results of labs that were added on ( they are in epic, but no notes from [facility] as he is on vacation)   Can you look at this, so the nurse can call pt back - thank you.

## 2019-10-01 ENCOUNTER — Other Ambulatory Visit: Payer: Self-pay | Admitting: Family Medicine

## 2019-10-01 DIAGNOSIS — R1011 Right upper quadrant pain: Secondary | ICD-10-CM

## 2019-10-01 DIAGNOSIS — R160 Hepatomegaly, not elsewhere classified: Secondary | ICD-10-CM

## 2019-10-01 NOTE — Telephone Encounter (Signed)
Pt aware.

## 2019-10-01 NOTE — Addendum Note (Signed)
Addended by: Sonny Masters on: 10/01/2019 09:50 AM   Modules accepted: Orders

## 2019-10-01 NOTE — Telephone Encounter (Signed)
Renal function is slightly declined. Make sure to drink plenty of water.  ALT is slightly elevated. Make sure to avoid tylenol and alcohol.  EBV IgG was positive but IgM was negative. Previous mono infection.

## 2019-10-06 ENCOUNTER — Telehealth: Payer: Self-pay | Admitting: Family

## 2019-10-06 ENCOUNTER — Other Ambulatory Visit: Payer: Self-pay | Admitting: Family Medicine

## 2019-10-06 DIAGNOSIS — R1900 Intra-abdominal and pelvic swelling, mass and lump, unspecified site: Secondary | ICD-10-CM

## 2019-10-06 DIAGNOSIS — K760 Fatty (change of) liver, not elsewhere classified: Secondary | ICD-10-CM

## 2019-10-06 NOTE — Telephone Encounter (Signed)
Aware.  He must go to [facility] for the contrast.

## 2019-10-07 ENCOUNTER — Other Ambulatory Visit: Payer: Self-pay

## 2019-10-07 ENCOUNTER — Ambulatory Visit (HOSPITAL_COMMUNITY)
Admission: RE | Admit: 2019-10-07 | Discharge: 2019-10-07 | Disposition: A | Discharge status: Home / Self Care | Payer: BC Managed Care – PPO | Source: Ambulatory Visit | Attending: Family Medicine | Admitting: Family Medicine

## 2019-10-07 ENCOUNTER — Encounter: Payer: Self-pay | Admitting: Family Medicine

## 2019-10-07 DIAGNOSIS — R1011 Right upper quadrant pain: Secondary | ICD-10-CM | POA: Insufficient documentation

## 2019-10-07 DIAGNOSIS — R160 Hepatomegaly, not elsewhere classified: Secondary | ICD-10-CM | POA: Diagnosis present

## 2019-10-07 MED ORDER — IOHEXOL 300 MG/ML  SOLN
100.0000 mL | Freq: Once | INTRAMUSCULAR | Status: AC | PRN
  Administered 2019-10-07: 12:00:00 100 mL via INTRAVENOUS

## 2019-10-10 ENCOUNTER — Other Ambulatory Visit: Payer: Self-pay | Admitting: Family

## 2019-10-10 DIAGNOSIS — K760 Fatty (change of) liver, not elsewhere classified: Secondary | ICD-10-CM

## 2019-10-28 ENCOUNTER — Other Ambulatory Visit: Payer: Self-pay

## 2019-10-28 ENCOUNTER — Ambulatory Visit (INDEPENDENT_AMBULATORY_CARE_PROVIDER_SITE_OTHER): Payer: BC Managed Care – PPO | Admitting: Family

## 2019-10-28 ENCOUNTER — Encounter: Payer: Self-pay | Admitting: Family

## 2019-10-28 VITALS — BP 118/78 | HR 65 | Temp 97.9°F | Ht 73.0 in | Wt 256.8 lb

## 2019-10-28 DIAGNOSIS — B002 Herpesviral gingivostomatitis and pharyngotonsillitis: Secondary | ICD-10-CM | POA: Insufficient documentation

## 2019-10-28 DIAGNOSIS — J4521 Mild intermittent asthma with (acute) exacerbation: Secondary | ICD-10-CM | POA: Diagnosis not present

## 2019-10-28 DIAGNOSIS — K219 Gastro-esophageal reflux disease without esophagitis: Secondary | ICD-10-CM | POA: Insufficient documentation

## 2019-10-28 DIAGNOSIS — Z0001 Encounter for general adult medical examination with abnormal findings: Secondary | ICD-10-CM

## 2019-10-28 DIAGNOSIS — K760 Fatty (change of) liver, not elsewhere classified: Secondary | ICD-10-CM | POA: Insufficient documentation

## 2019-10-28 DIAGNOSIS — Z0000 Encounter for general adult medical examination without abnormal findings: Secondary | ICD-10-CM

## 2019-10-28 NOTE — Progress Notes (Signed)
Subjective:    Patient ID: Cristian Sellers, male    DOB: February 11, 1996, 24 y.o.   MRN: [id number]  Chief Complaint  Patient presents with  . Follow-up    form for adoption    PT presents to the office today for CPE. He is currently in the process of adopting a 24 year old. They have been fostering him since he was 2 years.  Asthma There is no cough or wheezing. This is a chronic problem. The current episode started more than 1 year ago. The problem occurs intermittently. Associated symptoms include heartburn. He reports moderate improvement on treatment. His past medical history is significant for asthma.  Gastroesophageal Reflux He complains of belching and heartburn. He reports no coughing or no wheezing. This is a chronic problem. The current episode started more than 1 year ago. The problem occurs occasionally. The problem has been waxing and waning. He has tried a PPI for the symptoms. The treatment provided moderate relief.  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 Reports his last outbreak was about 6 months ago.     Review of Systems  Respiratory: Negative for cough and wheezing.   Gastrointestinal: Positive for heartburn.  All other systems reviewed and are negative.  Family History  Problem Relation Age of Onset  . Hypothyroidism Father    Social History   Socioeconomic History  . Marital status: Single    Spouse name: Not on file  . Number of children: Not on file  . Years of education: Not on file  . Highest education level: Not on file  Occupational History  . Not on file  Tobacco Use  . Smoking status: Never Smoker  . Smokeless tobacco: Never Used  Substance and Sexual Activity  . Alcohol use: No  . Drug use: No  . Sexual activity: Not on file  Other Topics Concern  . Not on file  Social History Narrative  . Not on file   Social Determinants of Health   Financial Resource Strain:   . Difficulty of Paying Living Expenses:   Food Insecurity:   . Worried  About Charity fundraiser in the Last Year:   . Arboriculturist in the Last Year:   Transportation Needs:   . Film/video editor (Medical):   Marland Kitchen Lack of Transportation (Non-Medical):   Physical Activity:   . Days of Exercise per Week:   . Minutes of Exercise per Session:   Stress:   . Feeling of Stress :   Social Connections:   . Frequency of Communication with Friends and Family:   . Frequency of Social Gatherings with Friends and Family:   . Attends Religious Services:   . Active Member of Clubs or Organizations:   . Attends Archivist Meetings:   Marland Kitchen Marital Status:   Intimate Partner Violence:   . Fear of Current or Ex-Partner:   . Emotionally Abused:   Marland Kitchen Physically Abused:   . Sexually Abused:        Objective:   Physical Exam Vitals reviewed.  Constitutional:      General: He is not in acute distress.    Appearance: He is well-developed.  HENT:     Head: Normocephalic.     Right Ear: Tympanic membrane normal.     Left Ear: Tympanic membrane normal.  Eyes:     General:        Right eye: No discharge.        Left eye:  No discharge.     Pupils: Pupils are equal, round, and reactive to light.  Neck:     Thyroid: No thyromegaly.  Cardiovascular:     Rate and Rhythm: Normal rate and regular rhythm.     Heart sounds: Normal heart sounds. No murmur.  Pulmonary:     Effort: Pulmonary effort is normal. No respiratory distress.     Breath sounds: Normal breath sounds. No wheezing.  Abdominal:     General: Bowel sounds are normal. There is no distension.     Palpations: Abdomen is soft.     Tenderness: There is no abdominal tenderness (mild upper).  Musculoskeletal:        General: No tenderness. Normal range of motion.     Cervical back: Normal range of motion and neck supple.  Skin:    General: Skin is warm and dry.     Findings: No erythema or rash.  Neurological:     Mental Status: He is alert and oriented to person, place, and time.     Cranial  Nerves: No cranial nerve deficit.     Deep Tendon Reflexes: Reflexes are normal and symmetric.  Psychiatric:        Behavior: Behavior normal.        Thought Content: Thought content normal.        Judgment: Judgment normal.     BP 118/78   Pulse 65   Temp 97.9 F (36.6 C) (Temporal)   Ht 6\' 1"  (1.854 m)   Wt 256 lb 12.8 oz (116.5 kg)   SpO2 98%   BMI 33.88 kg/m        Assessment & Plan:  Cristian Sellers comes in today with chief complaint of Follow-up (form for adoption )   Diagnosis and orders addressed:  1. Annual physical exam  2. Mild intermittent asthma with acute exacerbation  3. Gastroesophageal reflux disease, unspecified whether esophagitis present  4. Hepatic steatosis Long discussion with patient to avoid alcohol, tylenol. Strict low fat diet and encouraged weight loss. He will call GI tomorrow to make follow up appt  5. Oral herpes   Labs pending Health Maintenance reviewed Diet and exercise encouraged  Follow up plan: 1 year    [place], FNP

## 2019-10-28 NOTE — Patient Instructions (Signed)
Fatty Liver Disease  Fatty liver disease occurs when too much fat has built up in your liver cells. Fatty liver disease is also called hepatic steatosis or steatohepatitis. The liver removes harmful substances from your bloodstream and produces fluids that your body needs. It also helps your body use and store energy from the food you eat. In many cases, fatty liver disease does not cause symptoms or problems. It is often diagnosed when tests are being done for other reasons. However, over time, fatty liver can cause inflammation that may lead to more serious liver problems, such as scarring of the liver (cirrhosis) and liver failure. Fatty liver is associated with insulin resistance, increased body fat, high blood pressure (hypertension), and high cholesterol. These are features of metabolic syndrome and increase your risk for stroke, diabetes, and heart disease. What are the causes? This condition may be caused by:  Drinking too much alcohol.  Poor nutrition.  Obesity.  Cushing's syndrome.  Diabetes.  High cholesterol.  Certain drugs.  Poisons.  Some viral infections.  Pregnancy. What increases the risk? You are more likely to develop this condition if you:  Abuse alcohol.  Are overweight.  Have diabetes.  Have hepatitis.  Have a high triglyceride level.  Are pregnant. What are the signs or symptoms? Fatty liver disease often does not cause symptoms. If symptoms do develop, they can include:  Fatigue.  Weakness.  Weight loss.  Confusion.  Abdominal pain.  Nausea and vomiting.  Yellowing of your skin and the white parts of your eyes (jaundice).  Itchy skin. How is this diagnosed? This condition may be diagnosed by:  A physical exam and medical history.  Blood tests.  Imaging tests, such as an ultrasound, CT scan, or MRI.  A liver biopsy. A small sample of liver tissue is removed using a needle. The sample is then looked at under a microscope. How  is this treated? Fatty liver disease is often caused by other health conditions. Treatment for fatty liver may involve medicines and lifestyle changes to manage conditions such as:  Alcoholism.  High cholesterol.  Diabetes.  Being overweight or obese. Follow these instructions at home:   Do not drink alcohol. If you have trouble quitting, ask your health care provider how to safely quit with the help of medicine or a supervised program. This is important to keep your condition from getting worse.  Eat a healthy diet as told by your health care provider. Ask your health care provider about working with a diet and nutrition specialist (dietitian) to develop an eating plan.  Exercise regularly. This can help you lose weight and control your cholesterol and diabetes. Talk to your health care provider about an exercise plan and which activities are best for you.  Take over-the-counter and prescription medicines only as told by your health care provider.  Keep all follow-up visits as told by your health care provider. This is important. Contact a health care provider if: You have trouble controlling your:  Blood sugar. This is especially important if you have diabetes.  Cholesterol.  Drinking of alcohol. Get help right away if:  You have abdominal pain.  You have jaundice.  You have nausea and vomiting.  You vomit blood or material that looks like coffee grounds.  You have stools that are black, tar-like, or bloody. Summary  Fatty liver disease develops when too much fat builds up in the cells of your liver.  Fatty liver disease often causes no symptoms or problems. However, over   time, fatty liver can cause inflammation that may lead to more serious liver problems, such as scarring of the liver (cirrhosis).  You are more likely to develop this condition if you abuse alcohol, are pregnant, are overweight, have diabetes, have hepatitis, or have high triglyceride  levels.  Contact your health care provider if you have trouble controlling your weight, blood sugar, cholesterol, or drinking of alcohol. This information is not intended to replace advice given to you by your health care provider. Make sure you discuss any questions you have with your health care provider. Document Revised: 06/01/2017 Document Reviewed: 03/28/2017 Elsevier Patient Education  2020 Elsevier Inc.  

## 2019-12-03 ENCOUNTER — Encounter: Payer: Self-pay | Admitting: Family

## 2020-12-14 ENCOUNTER — Ambulatory Visit: Payer: BC Managed Care – PPO | Admitting: Nurse Practitioner

## 2020-12-14 ENCOUNTER — Other Ambulatory Visit: Payer: Self-pay | Admitting: Family

## 2020-12-14 ENCOUNTER — Encounter: Payer: Self-pay | Admitting: Nurse Practitioner

## 2020-12-14 DIAGNOSIS — J029 Acute pharyngitis, unspecified: Secondary | ICD-10-CM | POA: Diagnosis not present

## 2020-12-14 MED ORDER — AZITHROMYCIN 250 MG PO TABS: ORAL_TABLET | ORAL | 0 refills | Status: AC

## 2020-12-14 MED ORDER — PREDNISONE 10 MG (21) PO TBPK: ORAL_TABLET | ORAL | 0 refills | Status: DC

## 2020-12-14 MED ORDER — ALBUTEROL SULFATE HFA 108 (90 BASE) MCG/ACT IN AERS: INHALATION_SPRAY | RESPIRATORY_TRACT | 1 refills | Status: DC

## 2020-12-14 NOTE — Assessment & Plan Note (Signed)
Increase hydration, take medication as prescribed, Chloraseptic spray, Z-Pak 500 mg tablet day 1, 250 mg tablet day 2-5., prednisone taper, Tylenol/ibuprofen for pain. Rx sent to pharmacy.

## 2020-12-14 NOTE — Progress Notes (Signed)
   Virtual Visit  Note Due to COVID-19 pandemic this visit was conducted virtually. This visit type was conducted due to national recommendations for restrictions regarding the COVID-19 Pandemic (e.g. social distancing, sheltering in place) in an effort to limit this patient's exposure and mitigate transmission in our community. All issues noted in this document were discussed and addressed.  A physical exam was not performed with this format.  I connected with Cristian Sellers on 12/14/20 at 4:15 pm by telephone and verified that I am speaking with the correct person using two identifiers. Cristian Sellers is currently located at home during visit. The provider, Daryll Drown, NP is located in their office at time of visit.  I discussed the limitations, risks, security and privacy concerns of performing an evaluation and management service by telephone and the availability of in person appointments. I also discussed with the patient that there may be a patient responsible charge related to this service. The patient expressed understanding and agreed to proceed.   History and Present Illness:  Sore Throat  This is a recurrent problem. The current episode started in the past 7 days. The problem has been gradually worsening. Neither side of throat is experiencing more pain than the other. There has been no fever. The pain is moderate. Associated symptoms include congestion, coughing, headaches, a hoarse voice and trouble swallowing. Pertinent negatives include no ear pain or shortness of breath. He has had no exposure to strep. He has tried nothing for the symptoms.     Review of Systems  HENT:  Positive for congestion, hoarse voice and trouble swallowing. Negative for ear pain.   Respiratory:  Positive for cough. Negative for shortness of breath.   Neurological:  Positive for headaches.  All other systems reviewed and are negative.   Observations/Objective:  Tele-visit - Patient did not sound to be  in distress.  Assessment and Plan: Increase hydration, take medication as prescribed, Chloraseptic spray, Z-Pak 500 mg tablet day 1, 250 mg tablet day 2-5., prednisone taper, Tylenol/ibuprofen for pain. Rx sent to pharmacy.  Follow Up Instructions:  With worsening unresolved symptoms    I discussed the assessment and treatment plan with the patient. The patient was provided an opportunity to ask questions and all were answered. The patient agreed with the plan and demonstrated an understanding of the instructions.   The patient was advised to call back or seek an in-person evaluation if the symptoms worsen or if the condition fails to improve as anticipated.  The above assessment and management plan was discussed with the patient. The patient verbalized understanding of and has agreed to the management plan. Patient is aware to call the clinic if symptoms persist or worsen. Patient is aware when to return to the clinic for a follow-up visit. Patient educated on when it is appropriate to go to the emergency department.   Time call ended: 4:25 PM  I provided 10 minutes of  non face-to-face time during this encounter.    Daryll Drown, NP

## 2021-01-18 ENCOUNTER — Ambulatory Visit: Payer: BC Managed Care – PPO | Admitting: Nurse Practitioner

## 2021-01-18 ENCOUNTER — Encounter: Payer: Self-pay | Admitting: Nurse Practitioner

## 2021-01-18 ENCOUNTER — Other Ambulatory Visit: Payer: Self-pay

## 2021-01-18 ENCOUNTER — Ambulatory Visit (INDEPENDENT_AMBULATORY_CARE_PROVIDER_SITE_OTHER): Payer: BC Managed Care – PPO

## 2021-01-18 VITALS — BP 132/72 | HR 61 | Temp 97.8°F | Ht 73.0 in | Wt 256.0 lb

## 2021-01-18 DIAGNOSIS — F419 Anxiety disorder, unspecified: Secondary | ICD-10-CM | POA: Diagnosis not present

## 2021-01-18 DIAGNOSIS — M79641 Pain in right hand: Secondary | ICD-10-CM | POA: Diagnosis not present

## 2021-01-18 DIAGNOSIS — F321 Major depressive disorder, single episode, moderate: Secondary | ICD-10-CM | POA: Diagnosis not present

## 2021-01-18 DIAGNOSIS — R52 Pain, unspecified: Secondary | ICD-10-CM | POA: Diagnosis not present

## 2021-01-18 MED ORDER — IBUPROFEN 600 MG PO TABS: 600.0000 mg | ORAL_TABLET | Freq: Three times a day (TID) | ORAL | 0 refills | Status: DC | PRN

## 2021-01-18 MED ORDER — HYDROXYZINE HCL 10 MG PO TABS: 10.0000 mg | ORAL_TABLET | Freq: Three times a day (TID) | ORAL | 0 refills | Status: DC | PRN

## 2021-01-18 NOTE — Progress Notes (Addendum)
Acute Office Visit  Subjective:    Patient ID: Cristian Sellers, male    DOB: [birth year], 25 y.o.   MRN: [id number]  Chief Complaint  Patient presents with   Hand Pain    HPI Patient is in today for Pain  He reports new onset right hand pain. was an injury that may have caused the pain. The pain started yesterday and is staying constant. The pain does not radiate aching and slight pinlike sensation at the top of hand. The pain is described as aching, is moderate in intensity, occurring constantly. Symptoms are worse in the: morning, mid-day, afternoon, evening, nighttime  Aggravating factors: none Relieving factors: none.  He has tried application of heat and acetaminophen with little relief.     Past Medical History:  Diagnosis Date   Asthma     Past Surgical History:  Procedure Laterality Date   TONSILECTOMY, ADENOIDECTOMY, BILATERAL MYRINGOTOMY AND TUBES      Family History  Problem Relation Age of Onset   Anxiety disorder Mother    Hypothyroidism Father     Social History   Socioeconomic History   Marital status: Single    Spouse name: Not on file   Number of children: Not on file   Years of education: Not on file   Highest education level: Not on file  Occupational History   Not on file  Tobacco Use   Smoking status: Never   Smokeless tobacco: Never  Vaping Use   Vaping Use: Never used  Substance and Sexual Activity   Alcohol use: No   Drug use: No   Sexual activity: Yes    Birth control/protection: None  Other Topics Concern   Not on file  Social History Narrative   Not on file   Social Determinants of Health   Financial Resource Strain: Not on file  Food Insecurity: Not on file  Transportation Needs: Not on file  Physical Activity: Not on file  Stress: Not on file  Social Connections: Not on file  Intimate Partner Violence: Not on file    Outpatient Medications Prior to Visit  Medication Sig Dispense Refill   albuterol (VENTOLIN HFA) 108  (90 Base) MCG/ACT inhaler USE 2 PUFFS EVERY 6 HOURS AS NEEDED 18 g 0   pantoprazole (PROTONIX) 40 MG tablet Take 1 tablet (40 mg total) by mouth 2 (two) times daily. TAKE ONE (1) TABLET EACH DAY 180 tablet 3   predniSONE (STERAPRED UNI-PAK 21 TAB) 10 MG (21) TBPK tablet 6 tablets day 1, 5 tablet day 2, 4 tablet day 3, 3 tablet day 4, 2 tablet day 5, 1 tablet day 6. 1 each 0   valACYclovir (VALTREX) 1000 MG tablet TAKE ONE TABLET TWICE DAILY AS NEEDED 60 tablet 2   No facility-administered medications prior to visit.    No Known Allergies  Review of Systems  Constitutional: Negative.   HENT: Negative.    Eyes: Negative.   Respiratory: Negative.    Gastrointestinal: Negative.   Musculoskeletal:  Positive for joint swelling.  Skin:  Negative for rash.  All other systems reviewed and are negative.     Objective:    Physical Exam Vitals and nursing note reviewed.  Constitutional:      Appearance: Normal appearance.  HENT:     Head: Normocephalic.     Right Ear: Ear canal and external ear normal.     Mouth/Throat:     Mouth: Mucous membranes are moist.     Pharynx: Oropharynx is clear.  Eyes:     Conjunctiva/sclera: Conjunctivae normal.  Cardiovascular:     Rate and Rhythm: Normal rate and regular rhythm.     Pulses: Normal pulses.     Heart sounds: Normal heart sounds.  Pulmonary:     Effort: Pulmonary effort is normal.     Breath sounds: Normal breath sounds.  Abdominal:     General: Bowel sounds are normal.  Musculoskeletal:     Right upper arm: Swelling and tenderness present.  Skin:    Findings: No rash.  Neurological:     Mental Status: He is alert and oriented to person, place, and time.    BP 132/72   Pulse 61   Temp 97.8 F (36.6 C) (Temporal)   Ht 6\' 1"  (1.854 m)   Wt 256 lb (116.1 kg)   SpO2 99%   BMI 33.78 kg/m  Wt Readings from Last 3 Encounters:  01/18/21 256 lb (116.1 kg)  10/28/19 256 lb 12.8 oz (116.5 kg)  09/05/18 264 lb (119.7 kg)     Health Maintenance Due  Topic Date Due   COVID-19 Vaccine (1) Never done   Pneumococcal Vaccine 51-58 Years old (1 - PCV) Never done   HPV VACCINES (1 - Male 2-dose series) Never done   HIV Screening  Never done       Topic Date Due   HPV VACCINES (1 - Male 2-dose series) Never done     Lab Results  Component Value Date   TSH 3.310 01/31/2017   Lab Results  Component Value Date   WBC 8.0 09/24/2019   HGB 15.7 09/24/2019   HCT 46.5 09/24/2019   MCV 87 09/24/2019   PLT 332 09/24/2019   Lab Results  Component Value Date   NA 141 09/24/2019   K 4.2 09/24/2019   CO2 24 09/24/2019   GLUCOSE 83 09/24/2019   BUN 13 09/24/2019   CREATININE 1.28 (H) 09/24/2019   BILITOT 1.2 09/24/2019   ALKPHOS 61 09/24/2019   AST 30 09/24/2019   ALT 59 (H) 09/24/2019   PROT 7.1 09/24/2019   ALBUMIN 4.7 09/24/2019   CALCIUM 9.5 09/24/2019   Lab Results  Component Value Date   CHOL 138 01/31/2017   Lab Results  Component Value Date   HDL 43 01/31/2017   Lab Results  Component Value Date   LDLCALC 76 01/31/2017   Lab Results  Component Value Date   TRIG 95 01/31/2017   Lab Results  Component Value Date   CHOLHDL 3.2 01/31/2017   No results found for: HGBA1C     Assessment & Plan:   Problem List Items Addressed This Visit       Other   Depression, major, single episode, moderate (HCC)    Depression symptoms not well controlled.  Patient willing to start antidepressant today and attributes depression to worsening anxiety.  Provided education to patient printed handouts given.  Follow-up with unresolved symptoms.       Relevant Medications   hydrOXYzine (ATARAX/VISTARIL) 10 MG tablet   Anxiety    Completed GAD-7, patient reporting not sleeping well which may be causing his anxiety, advised patient to use melatonin for sleep, Atarax for anxiety, follow-up with worsening unresolved symptoms.       Relevant Medications   hydrOXYzine (ATARAX/VISTARIL) 10 MG tablet    Pain - Primary    Patient had a slight fall but broke the fall on his right hand which caused pain to right wrist.  On assessment limited range of  motion, swelling and tenderness.  Completed x-ray of right wrist which shows no fractures.  Advised patient to use anti-inflammatory [ibuprofen], ice, or warm compress as tolerated, brace and follow-up with worsening or unresolved symptoms.  Patient verbalized understanding.  Rx sent to pharmacy.       Relevant Medications   ibuprofen (ADVIL) 600 MG tablet   Other Relevant Orders   DG Hand Complete Right (Completed)     Meds ordered this encounter  Medications   hydrOXYzine (ATARAX/VISTARIL) 10 MG tablet    Sig: Take 1 tablet (10 mg total) by mouth 3 (three) times daily as needed.    Dispense:  30 tablet    Refill:  0    Order Specific Question:   Supervising Provider    Answer:   Raliegh Ip [[id number]]   ibuprofen (ADVIL) 600 MG tablet    Sig: Take 1 tablet (600 mg total) by mouth every 8 (eight) hours as needed.    Dispense:  30 tablet    Refill:  0    Order Specific Question:   Supervising Provider    Answer:   Raliegh Ip [[id number]]     Daryll Drown, NP

## 2021-01-18 NOTE — Assessment & Plan Note (Signed)
Completed GAD-7, patient reporting not sleeping well which may be causing his anxiety, advised patient to use melatonin for sleep, Atarax for anxiety, follow-up with worsening unresolved symptoms.

## 2021-01-18 NOTE — Assessment & Plan Note (Signed)
Patient had a slight fall but broke the fall on his right hand which caused pain to right wrist.  On assessment limited range of motion, swelling and tenderness.  Completed x-ray of right wrist which shows no fractures.  Advised patient to use anti-inflammatory [ibuprofen], ice, or warm compress as tolerated, brace and follow-up with worsening or unresolved symptoms.  Patient verbalized understanding.  Rx sent to pharmacy.

## 2021-01-18 NOTE — Patient Instructions (Signed)
http://APA.org/depression-guideline"> https://clinicalkey.com"> http://point-of-care.elsevierperformancemanager.com/skills/"> http://point-of-care.elsevierperformancemanager.com">  Managing Depression, Adult Depression is a mental health condition that affects your thoughts, feelings, and actions. Being diagnosed with depression can bring you relief if you did not know why you have felt or behaved a certain way. It could also leave you feeling overwhelmed with uncertainty about your future. Preparing yourself tomanage your symptoms can help you feel more positive about your future. How to manage lifestyle changes Managing stress  Stress is your body's reaction to life changes and events, both good and bad. Stress can add to your feelings of depression. Learning to manage your stresscan help lessen your feelings of depression. Try some of the following approaches to reducing your stress (stress reduction techniques): Listen to music that you enjoy and that inspires you. Try using a meditation app or take a meditation class. Develop a practice that helps you connect with your spiritual self. Walk in nature, pray, or go to a place of worship. Do some deep breathing. To do this, inhale slowly through your nose. Pause at the top of your inhale for a few seconds and then exhale slowly, letting your muscles relax. Practice yoga to help relax and work your muscles. Choose a stress reduction technique that suits your lifestyle and personality. These techniques take time and practice to develop. Set aside 5-15 minutes a day to do them. Therapists can offer training in these techniques. Other things you can do to manage stress include: Keeping a stress diary. Knowing your limits and saying no when you think something is too much. Paying attention to how you react to certain situations. You may not be able to control everything, but you can change your reaction. Adding humor to your life by watching funny films  or TV shows. Making time for activities that you enjoy and that relax you.  Medicines Medicines, such as antidepressants, are often a part of treatment for depression. Talk with your pharmacist or health care provider about all the medicines, supplements, and herbal products that you take, their possible side effects, and what medicines and other products are safe to take together. Make sure to report any side effects you may have to your health care provider. Relationships Your health care provider may suggest family therapy, couples therapy, orindividual therapy as part of your treatment. How to recognize changes Everyone responds differently to treatment for depression. As you recover from depression, you may start to: Have more interest in doing activities. Feel less hopeless. Have more energy. Overeat less often, or have a better appetite. Have better mental focus. It is important to recognize if your depression is not getting better or is getting worse. The symptoms you had in the beginning may return, such as: Tiredness (fatigue) or low energy. Eating too much or too little. Sleeping too much or too little. Feeling restless, agitated, or hopeless. Trouble focusing or making decisions. Unexplained physical complaints. Feeling irritable, angry, or aggressive. If you or your family members notice these symptoms coming back, let yourhealth care provider know right away. Follow these instructions at home: Activity  Try to get some form of exercise each day, such as walking, biking, swimming, or lifting weights. Practice stress reduction techniques. Engage your mind by taking a class or doing some volunteer work.  Lifestyle Get the right amount and quality of sleep. Cut down on using caffeine, tobacco, alcohol, and other potentially harmful substances. Eat a healthy diet that includes plenty of vegetables, fruits, whole grains, low-fat dairy products, and lean protein. Do not   eat  a lot of foods that are high in solid fats, added sugars, or salt (sodium). General instructions Take over-the-counter and prescription medicines only as told by your health care provider. Keep all follow-up visits as told by your health care provider. This is important. Where to find support Talking to others  Friends and family members can be sources of support and guidance. Talk to trusted friends or family members about your condition. Explain your symptoms to them, and let them know that you are working with a health care provider to treat your depression. Tell friends and family members how they also can behelpful. Finances Find appropriate mental health providers that fit with your financial situation. Talk with your health care provider about options to get reduced prices on your medicines. Where to find more information You can find support in your area from: Anxiety and Depression Association of America (ADAA): www.adaa.org Mental Health America: www.mentalhealthamerica.net National Alliance on Mental Illness: www.nami.org Contact a health care provider if: You stop taking your antidepressant medicines, and you have any of these symptoms: Nausea. Headache. Light-headedness. Chills and body aches. Not being able to sleep (insomnia). You or your friends and family think your depression is getting worse. Get help right away if: You have thoughts of hurting yourself or others. If you ever feel like you may hurt yourself or others, or have thoughts about taking your own life, get help right away. Go to your nearest emergency department or: Call your local emergency services (911 in the [place] Call a suicide crisis helpline, such as the National Suicide Prevention Lifeline at 1-800-273-8255. This is open 24 hours a day in the [place] Text the Crisis Text Line at 741741 (in the [place] Summary If you are diagnosed with depression, preparing yourself to manage your symptoms is a good way  to feel positive about your future. Work with your health care provider on a management plan that includes stress reduction techniques, medicines (if applicable), therapy, and healthy lifestyle habits. Keep talking with your health care provider about how your treatment is working. If you have thoughts about taking your own life, call a suicide crisis helpline or text a crisis text line. This information is not intended to replace advice given to you by your health care provider. Make sure you discuss any questions you have with your healthcare provider. Document Revised: 04/30/2019 Document Reviewed: 04/30/2019 Elsevier Patient Education  2022 Elsevier Inc. http://NIMH.NIH.Gov">  Generalized Anxiety Disorder, Adult Generalized anxiety disorder (GAD) is a mental health condition. Unlike normal worries, anxiety related to GAD is not triggered by a specific event. These worries do not fade or get better with time. GAD interferes with relationships,work, and school. GAD symptoms can vary from mild to severe. People with severe GAD can have intense waves of anxiety with physical symptoms that are similar to panicattacks. What are the causes? The exact cause of GAD is not known, but the following are believed to have an impact: Differences in natural brain chemicals. Genes passed down from parents to children. Differences in the way threats are perceived. Development during childhood. Personality. What increases the risk? The following factors may make you more likely to develop this condition: Being male. Having a family history of anxiety disorders. Being very shy. Experiencing very stressful life events, such as the death of a loved one. Having a very stressful family environment. What are the signs or symptoms? People with GAD often worry excessively about many things in their lives, such as their health   and family. Symptoms may also include: Mental and emotional symptoms: Worrying  excessively about natural disasters. Fear of being late. Difficulty concentrating. Fears that others are judging your performance. Physical symptoms: Fatigue. Headaches, muscle tension, muscle twitches, trembling, or feeling shaky. Feeling like your heart is pounding or beating very fast. Feeling out of breath or like you cannot take a deep breath. Having trouble falling asleep or staying asleep, or experiencing restlessness. Sweating. Nausea, diarrhea, or irritable bowel syndrome (IBS). Behavioral symptoms: Experiencing erratic moods or irritability. Avoidance of new situations. Avoidance of people. Extreme difficulty making decisions. How is this diagnosed? This condition is diagnosed based on your symptoms and medical history. You will also have a physical exam. Your health care provider may perform tests torule out other possible causes of your symptoms. To be diagnosed with GAD, a person must have anxiety that: Is out of his or her control. Affects several different aspects of his or her life, such as work and relationships. Causes distress that makes him or her unable to take part in normal activities. Includes at least three symptoms of GAD, such as restlessness, fatigue, trouble concentrating, irritability, muscle tension, or sleep problems. Before your health care provider can confirm a diagnosis of GAD, these symptoms must be present more days than they are not, and they must last for 6 months orlonger. How is this treated? This condition may be treated with: Medicine. Antidepressant medicine is usually prescribed for long-term daily control. Anti-anxiety medicines may be added in severe cases, especially when panic attacks occur. Talk therapy (psychotherapy). Certain types of talk therapy can be helpful in treating GAD by providing support, education, and guidance. Options include: Cognitive behavioral therapy (CBT). People learn coping skills and self-calming techniques to  ease their physical symptoms. They learn to identify unrealistic thoughts and behaviors and to replace them with more appropriate thoughts and behaviors. Acceptance and commitment therapy (ACT). This treatment teaches people how to be mindful as a way to cope with unwanted thoughts and feelings. Biofeedback. This process trains you to manage your body's response (physiological response) through breathing techniques and relaxation methods. You will work with a therapist while machines are used to monitor your physical symptoms. Stress management techniques. These include yoga, meditation, and exercise. A mental health specialist can help determine which treatment is best for you. Some people see improvement with one type of therapy. However, other peoplerequire a combination of therapies. Follow these instructions at home: Lifestyle Maintain a consistent routine and schedule. Anticipate stressful situations. Create a plan, and allow extra time to work with your plan. Practice stress management or self-calming techniques that you have learned from your therapist or your health care provider. General instructions Take over-the-counter and prescription medicines only as told by your health care provider. Understand that you are likely to have setbacks. Accept this and be kind to yourself as you persist to take better care of yourself. Recognize and accept your accomplishments, even if you judge them as small. Keep all follow-up visits as told by your health care provider. This is important. Contact a health care provider if: Your symptoms do not get better. Your symptoms get worse. You have signs of depression, such as: A persistently sad or irritable mood. Loss of enjoyment in activities that used to bring you joy. Change in weight or eating. Changes in sleeping habits. Avoiding friends or family members. Loss of energy for normal tasks. Feelings of guilt or worthlessness. Get help right away  if: You have serious thoughts   about hurting yourself or others. If you ever feel like you may hurt yourself or others, or have thoughts about taking your own life, get help right away. Go to your nearest emergency department or: Call your local emergency services (911 in the [place] Call a suicide crisis helpline, such as the National Suicide Prevention Lifeline at 2700441571. This is open 24 hours a day in the [place] Text the Crisis Text Line at 445-518-2447 (in the [place] Summary Generalized anxiety disorder (GAD) is a mental health condition that involves worry that is not triggered by a specific event. People with GAD often worry excessively about many things in their lives, such as their health and family. GAD may cause symptoms such as restlessness, trouble concentrating, sleep problems, frequent sweating, nausea, diarrhea, headaches, and trembling or muscle twitching. A mental health specialist can help determine which treatment is best for you. Some people see improvement with one type of therapy. However, other people require a combination of therapies. This information is not intended to replace advice given to you by your health care provider. Make sure you discuss any questions you have with your healthcare provider. Document Revised: 04/09/2019 Document Reviewed: 04/09/2019 Elsevier Patient Education  2022 Elsevier Inc. Wrist Pain, Adult There are many things that can cause wrist pain. Some common causes include: An injury to the wrist. Using the joint too much. A condition that causes too much pressure to be put on a nerve in the wrist (carpal tunnel syndrome). Wear and tear of the joints that happens as a person gets older (osteoarthritis). A condition that causes swelling and stiffness in the joints (arthritis). Sometimes, the cause of wrist pain is not known. Often, the pain goes away when you follow your doctor's instructions for easing pain at home. This may include resting your  wrist, icing your wrist, or using a splint or an elastic wrap for a short time. It is important to tell your doctorif your wrist pain does not go away. Follow these instructions at home: If you have a splint or elastic wrap: Wear the splint or wrap as told by your doctor. Take it off only as told by your doctor. Ask if you can take it off for bathing. Loosen the splint or wrap if your fingers: Tingle. Become numb. Turn cold and blue. Check the skin around the splint or wrap every day. Tell your doctor about any concerns. Keep the splint or wrap clean. If the splint or wrap is not waterproof: Do not let it get wet. Cover it with a watertight covering when you take a bath or shower. Managing pain, stiffness, and swelling  If told, put ice on the painful area. To do this: If you have a removable splint or wrap, take it off as told by your doctor. Put ice in a plastic bag. Place a towel between your skin and the bag or between your splint or wrap and the bag. Leave the ice on for 20 minutes, 2-3 times a day. Move your fingers often. Raise (elevate) the injured area above the level of your heart while you are sitting or lying down.  Activity Rest your wrist as told by your doctor. Return to your normal activities as told by your doctor. Ask your doctor what activities are safe for you. Ask your doctor when it is safe to drive if you have a splint or wrap on your wrist. Do exercises as told by your doctor. General instructions Pay attention to any changes in your symptoms.  Take over-the-counter and prescription medicines only as told by your doctor. Keep all follow-up visits as told by your doctor. This is important. Contact a doctor if: You have a sudden, sharp pain in the wrist, hand, or arm that is different or new. The swelling or bruising on your wrist or hand gets worse. Your skin: Becomes red. Gets a rash. Has open sores. Your pain does not get better. Your pain gets  worse. You have a fever or chills. Get help right away if: You lose feeling in your fingers or hand. Your fingers turn white, very red, or cold and blue. You cannot move your fingers. Summary There are many things that can cause wrist pain. It is important to tell your doctor if your wrist pain does not go away. You may need to wear a splint or a wrap for a short period of time. Return to your normal activities as told by your doctor. Ask your doctor what activities are safe for you. This information is not intended to replace advice given to you by your health care provider. Make sure you discuss any questions you have with your healthcare provider. Document Revised: 05/08/2019 Document Reviewed: 05/08/2019 Elsevier Patient Education  2022 [employer].

## 2021-01-18 NOTE — Assessment & Plan Note (Signed)
Depression symptoms not well controlled.  Patient willing to start antidepressant today and attributes depression to worsening anxiety.  Provided education to patient printed handouts given.  Follow-up with unresolved symptoms.

## 2021-02-14 IMAGING — US US ABDOMEN COMPLETE
1 series · 13 of 25 positions shown · non-contrast
Comparison: None

CLINICAL DATA: Periumbilical and mid upper abdominal pain with
bloating for 3 months, acute gastritis

EXAM:
ABDOMEN ULTRASOUND COMPLETE

[Series 1: us abdomen complete · 0.23mm/px · 13 of 144 slices shown]
[im 1/144]
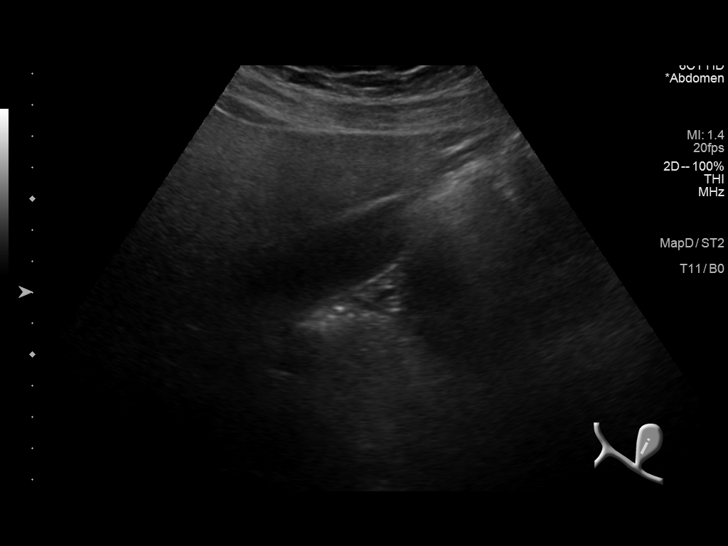
[im 12/144]
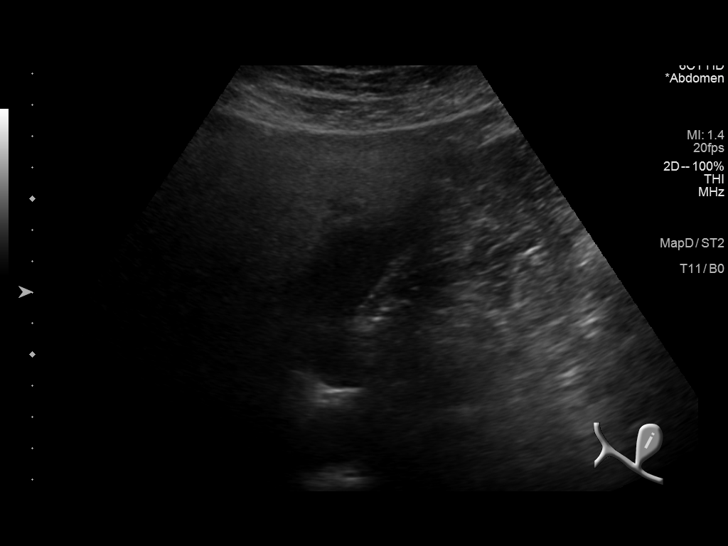
[im 24/144]
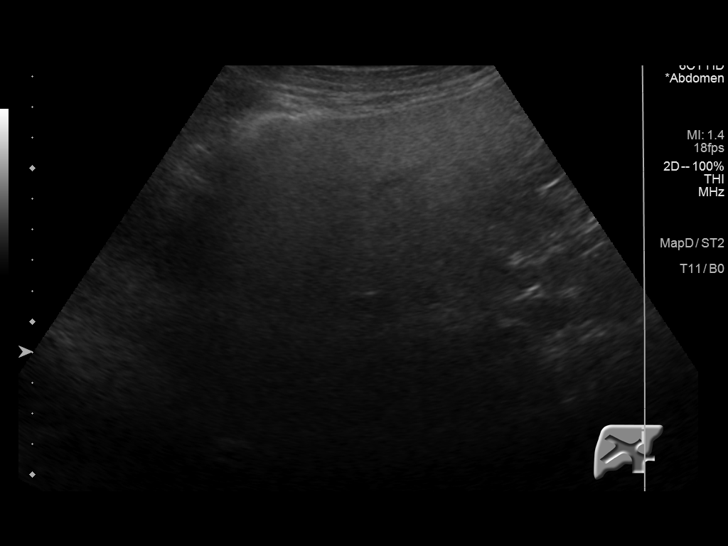
[im 36/144]
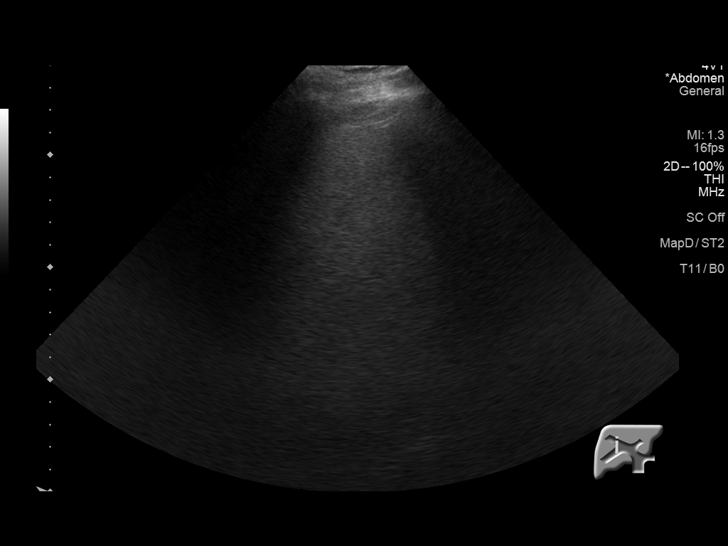
[im 48/144]
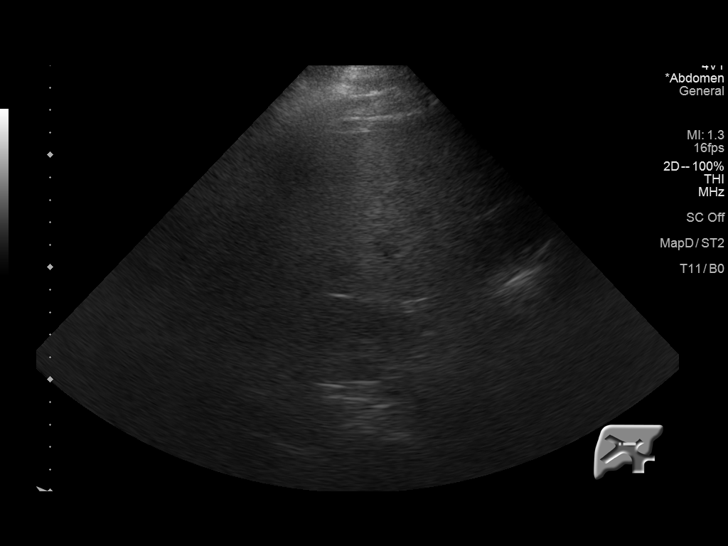
[im 60/144]
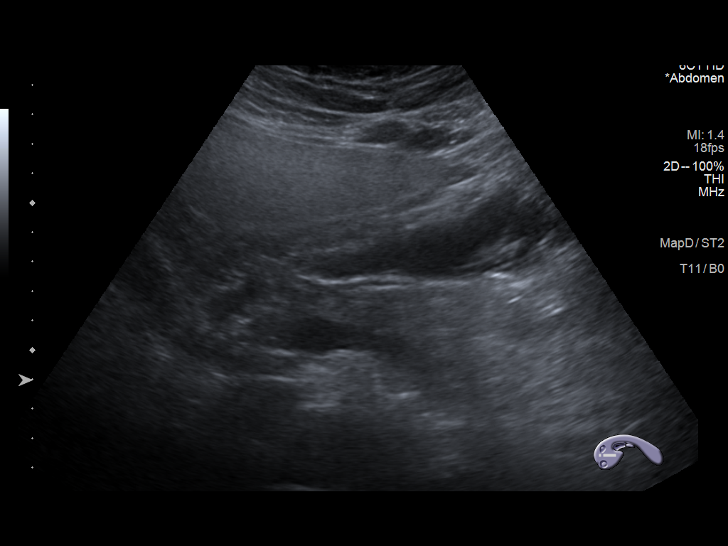
[im 72/144]
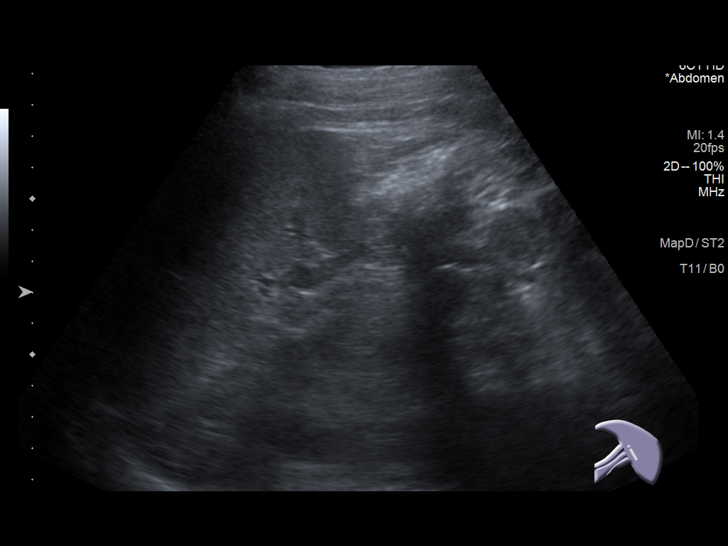
[im 84/144]
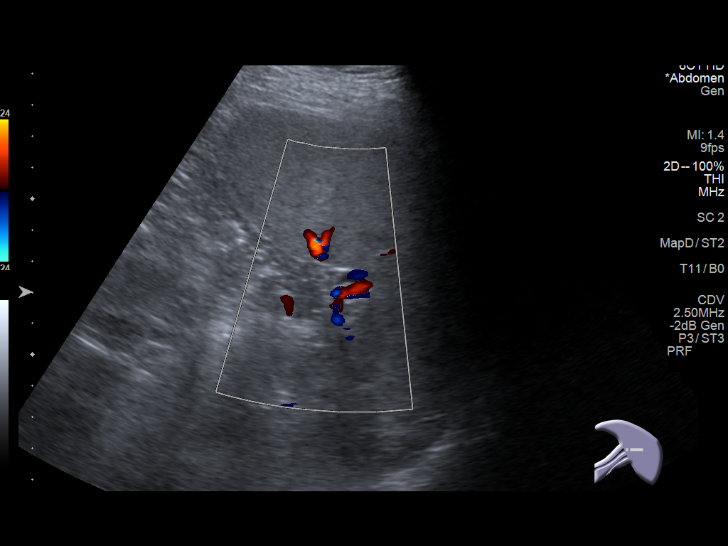
[im 96/144]
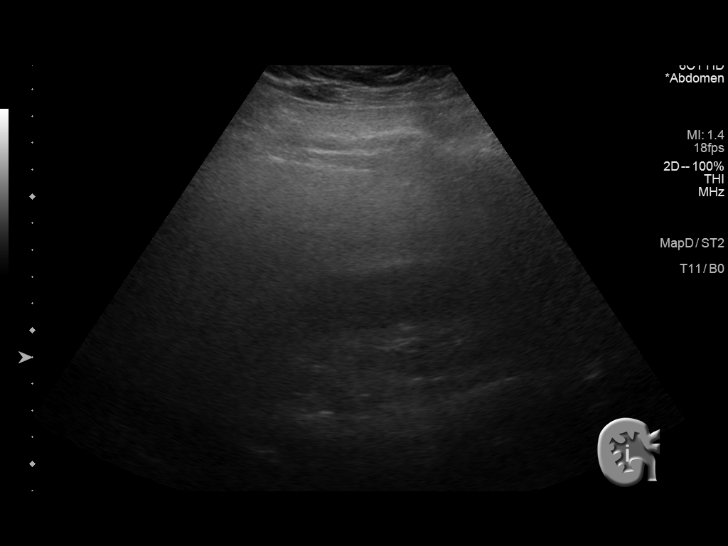
[im 108/144]
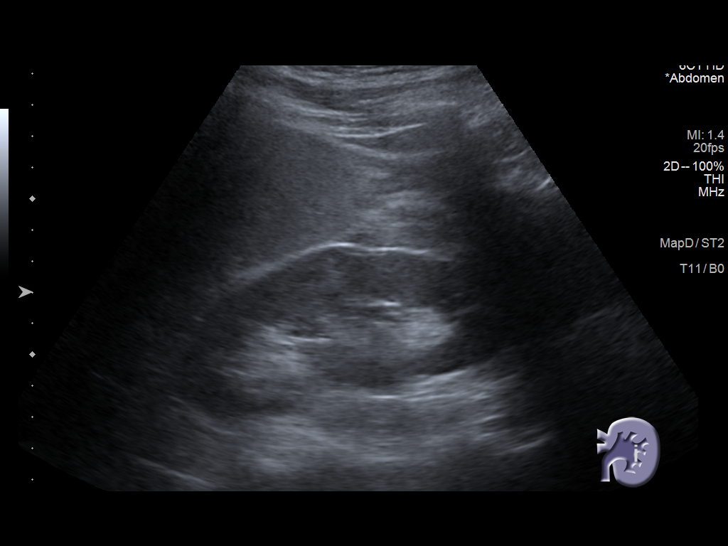
[im 120/144]
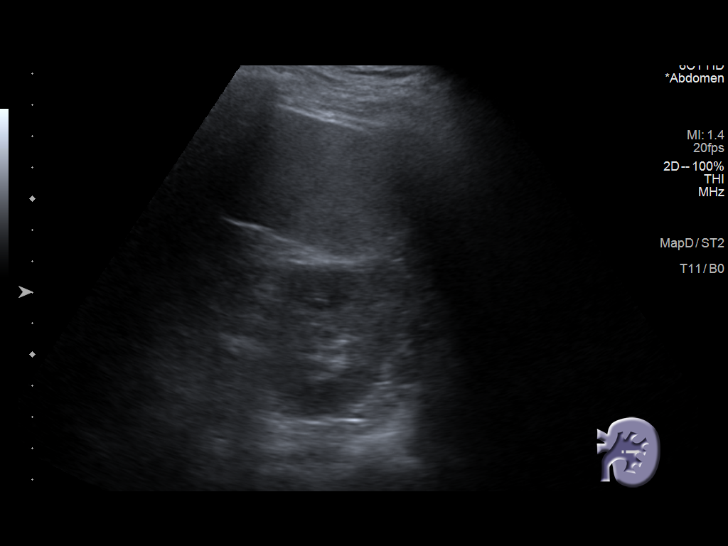
[im 132/144]
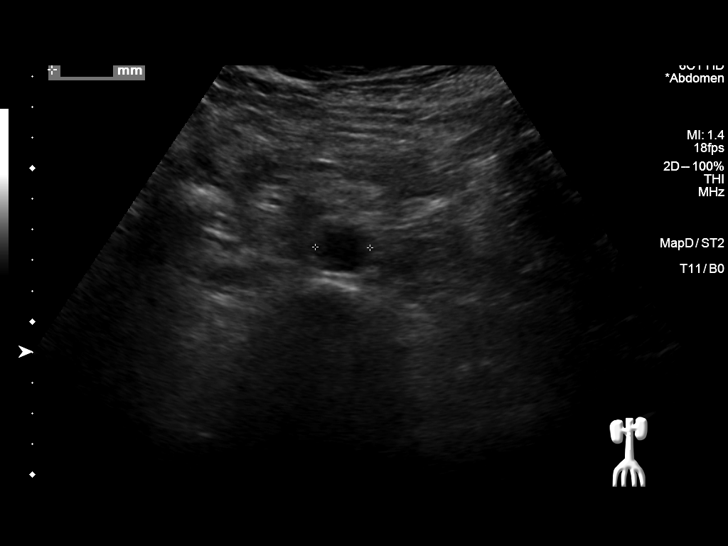
[im 144/144]
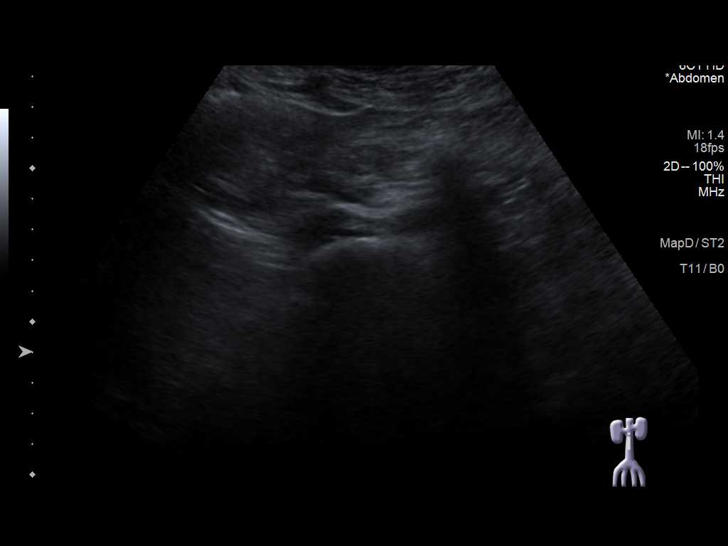

[13 of 25 positions shown; findings below may reference images not displayed]

FINDINGS: Gallbladder: Normally distended without stones or wall thickening.
No pericholecystic fluid or sonographic Murphy sign.

Common bile duct: Diameter: Short segment 3 mm diameter visualized

Liver: Echogenic parenchyma, likely fatty infiltration though this
can be seen with cirrhosis and certain infiltrative disorders. Poor
assessment of intrahepatic detail due to sound attenuation.
Subjectively liver appears enlarged. No gross evidence of mass or
nodularity on limited assessment. Portal vein is patent on color
Doppler imaging with normal direction of blood flow towards the
liver.

IVC: Normal appearance

Pancreas: Normal appearance

Spleen: Normal appearance, 9.8 cm length

Right Kidney: Length: 11.8 cm. Normal morphology without mass or
hydronephrosis.

Left Kidney: Length: 11.4 cm. Normal morphology without mass or
hydronephrosis.

Abdominal aorta: Normal caliber

Other findings: No free fluid
IMPRESSION: Probable fatty infiltration and enlargement of liver as above.

Poor assessment of intrahepatic detail due to significant sound
attenuation; if better intra hepatic visualization is required,
recommend either MR or less likely CT for better assessment.

## 2021-03-23 ENCOUNTER — Telehealth: Payer: Self-pay | Admitting: Physician Assistant

## 2021-03-23 DIAGNOSIS — N492 Inflammatory disorders of scrotum: Secondary | ICD-10-CM

## 2021-03-24 ENCOUNTER — Ambulatory Visit: Payer: BC Managed Care – PPO | Admitting: Family

## 2021-03-24 ENCOUNTER — Encounter: Payer: Self-pay | Admitting: Family

## 2021-03-24 DIAGNOSIS — L0291 Cutaneous abscess, unspecified: Secondary | ICD-10-CM | POA: Diagnosis not present

## 2021-03-24 DIAGNOSIS — J4521 Mild intermittent asthma with (acute) exacerbation: Secondary | ICD-10-CM | POA: Diagnosis not present

## 2021-03-24 DIAGNOSIS — B002 Herpesviral gingivostomatitis and pharyngotonsillitis: Secondary | ICD-10-CM

## 2021-03-24 DIAGNOSIS — K219 Gastro-esophageal reflux disease without esophagitis: Secondary | ICD-10-CM | POA: Diagnosis not present

## 2021-03-24 MED ORDER — ALBUTEROL SULFATE HFA 108 (90 BASE) MCG/ACT IN AERS: 1.0000 | INHALATION_SPRAY | Freq: Four times a day (QID) | RESPIRATORY_TRACT | 0 refills | Status: DC | PRN

## 2021-03-24 MED ORDER — AMOXICILLIN-POT CLAVULANATE 875-125 MG PO TABS: 1.0000 | ORAL_TABLET | Freq: Two times a day (BID) | ORAL | 0 refills | Status: DC

## 2021-03-24 MED ORDER — PANTOPRAZOLE SODIUM 40 MG PO TBEC: 40.0000 mg | DELAYED_RELEASE_TABLET | Freq: Every day | ORAL | 3 refills | Status: DC

## 2021-03-24 NOTE — Progress Notes (Signed)
Based on what you shared with me, I feel your condition warrants further evaluation and I recommend that you be seen in a face to face visit.  Definite concern for a scrotal wall abscess. This can be more serious that abscesses in other area and can lead to more complicated infections, moving to testicles/penis if not handled properly. As such I recommend an in-person evaluation with your PCP or at a local Urgent care for detailed examination to rule out any complications from this and to drain/start proper antibiotics ASAP.    NOTE: There will be NO CHARGE for this eVisit   If you are having a true medical emergency please call 911.      For an urgent face to face visit,  has six urgent care centers for your convenience:     [facility] Directions [phone] [address] [place], [place] [address]    [facility]) Get Driving Directions [phone] [address] [place], [place] [address]  [facility]) Get Driving Directions [phone] [address] [place],  [place]  [address]  [facility] at [facility] Get Driving Directions [phone] [address] [place], [place] [address]   [facility] at [facility] Get Driving Directions  [phone] [address] [place], [place] [address]   [facility] at [facility] Directions [phone] [address], [address] [place], [place] [address]  Your MyChart E-visit questionnaire answers were reviewed by a board certified advanced clinical practitioner to complete your personal care plan based on your specific symptoms.  Thank you for using e-Visits.

## 2021-03-24 NOTE — Progress Notes (Signed)
Virtual Visit  Note Due to COVID-19 pandemic this visit was conducted virtually. This visit type was conducted due to national recommendations for restrictions regarding the COVID-19 Pandemic (e.g. social distancing, sheltering in place) in an effort to limit this patient's exposure and mitigate transmission in our community. All issues noted in this document were discussed and addressed.  A physical exam was not performed with this format.  I connected with Cristian Sellers on 03/24/21 at 8:14 AM  by video and verified that I am speaking with the correct person using two identifiers. Cristian Sellers is currently located at home and no one is currently with him  during visit. The provider, Evelina Dun, FNP is located in their office at time of visit.  I discussed the limitations, risks, security and privacy concerns of performing an evaluation and management service by video and the availability of in person appointments. I also discussed with the patient that there may be a patient responsible charge related to this service. The patient expressed understanding and agreed to proceed.  [name], Cristian Sellers are scheduled for a virtual visit with your provider today.    Just as we do with appointments in the office, we must obtain your consent to participate.  Your consent will be active for this visit and any virtual visit you may have with one of our providers in the next 365 days.    If you have a MyChart account, I can also send a copy of this consent to you electronically.  All virtual visits are billed to your insurance company just like a traditional visit in the office.  As this is a virtual visit, video technology does not allow for your provider to perform a traditional examination.  This may limit your provider's ability to fully assess your condition.  If your provider identifies any concerns that need to be evaluated in person or the need to arrange testing such as labs, EKG, etc, we will make arrangements  to do so.    Although advances in technology are sophisticated, we cannot ensure that it will always work on either your end or our end.  If the connection with a video visit is poor, we may have to switch to a telephone visit.  With either a video or telephone visit, we are not always able to ensure that we have a secure connection.   I need to obtain your verbal consent now.   Are you willing to proceed with your visit today?   Cristian Sellers has provided verbal consent on 03/24/2021 for a virtual visit (video or telephone).   Evelina Dun, [place] 03/24/2021  8:16 AM   History and Present Illness:  Pt calls the office today for chronic follow up and medication refill. He is also complaining of an abscess on his right pubic area and left knee that he noticed several days ago. He has noticed yellow/green, bloody discharge. The area is tender and warm.  Asthma There is no cough or wheezing. This is a chronic problem. The current episode started more than 1 year ago. Associated symptoms include heartburn. He reports moderate improvement on treatment. His past medical history is significant for asthma.  Gastroesophageal Reflux He complains of belching and heartburn. He reports no coughing or no wheezing. This is a chronic problem. The current episode started more than 1 year ago. The problem occurs occasionally. Risk factors include obesity. He has tried a PPI for the symptoms. The treatment provided moderate relief.  Oral Herpes Takes Valtrex  as needed, last flare up was in May.     Review of Systems  Respiratory:  Negative for cough and wheezing.   Gastrointestinal:  Positive for heartburn.    Observations/Objective: No SOB or distress noted. At home. Skin on pubic area very red, and draining bloody discharge. Knee has white head, and not discharge noted.   Assessment and Plan: 1. Mild intermittent asthma with acute exacerbation - CMP14+EGFR; Future - albuterol (VENTOLIN HFA) 108 (90 Base)  MCG/ACT inhaler; Inhale 1-2 puffs into the lungs every 6 (six) hours as needed for wheezing or shortness of breath.  Dispense: 18 g; Refill: 0  2. Gastroesophageal reflux disease, unspecified whether esophagitis present -Diet discussed- Avoid fried, spicy, citrus foods, caffeine and alcohol -Do not eat 2-3 hours before bedtime -Encouraged small frequent meals -Avoid NSAID's - pantoprazole (PROTONIX) 40 MG tablet; Take 1 tablet (40 mg total) by mouth daily. TAKE ONE (1) TABLET EACH DAY  Dispense: 90 tablet; Refill: 3 - CMP14+EGFR; Future  3. Oral herpes - CMP14+EGFR; Future  4. Abscess Keep clean and dry Mark area with sharpie Let us know if redness, warmth, discharge, or pain worsens or does not improve. Will need to be seen Monday if not improving.  - CMP14+EGFR; Future - amoxicillin-clavulanate (AUGMENTIN) 875-125 MG tablet; Take 1 tablet by mouth 2 (two) times daily.  Dispense: 14 tablet; Refill: 0   I discussed the assessment and treatment plan with the patient. The patient was provided an opportunity to ask questions and all were answered. The patient agreed with the plan and demonstrated an understanding of the instructions.   The patient was advised to call back or seek an in-person evaluation if the symptoms worsen or if the condition fails to improve as anticipated.  The above assessment and management plan was discussed with the patient. The patient verbalized understanding of and has agreed to the management plan. Patient is aware to call the clinic if symptoms persist or worsen. Patient is aware when to return to the clinic for a follow-up visit. Patient educated on when it is appropriate to go to the emergency department.   Time call ended:  8:38 AM   I provided 24 minutes of  face-to-face time during this encounter.    Evelina Dun, FNP

## 2021-06-09 ENCOUNTER — Other Ambulatory Visit: Payer: Self-pay | Admitting: Family

## 2021-07-29 ENCOUNTER — Ambulatory Visit: Payer: BC Managed Care – PPO | Admitting: Nurse Practitioner

## 2021-07-29 ENCOUNTER — Other Ambulatory Visit: Payer: Self-pay | Admitting: Nurse Practitioner

## 2021-07-29 ENCOUNTER — Encounter: Payer: Self-pay | Admitting: Nurse Practitioner

## 2021-07-29 DIAGNOSIS — J00 Acute nasopharyngitis [common cold]: Secondary | ICD-10-CM | POA: Insufficient documentation

## 2021-07-29 DIAGNOSIS — R509 Fever, unspecified: Secondary | ICD-10-CM | POA: Diagnosis not present

## 2021-07-29 LAB — VERITOR FLU A/B WAIVED
Influenza A: NEGATIVE
Influenza B: NEGATIVE

## 2021-07-29 MED ORDER — PSEUDOEPH-BROMPHEN-DM 30-2-10 MG/5ML PO SYRP: 5.0000 mL | ORAL_SOLUTION | Freq: Four times a day (QID) | ORAL | 0 refills | Status: DC | PRN

## 2021-07-29 NOTE — Progress Notes (Signed)
° °  Virtual Visit  Note Due to COVID-19 pandemic this visit was conducted virtually. This visit type was conducted due to national recommendations for restrictions regarding the COVID-19 Pandemic (e.g. social distancing, sheltering in place) in an effort to limit this patient's exposure and mitigate transmission in our community. All issues noted in this document were discussed and addressed.  A physical exam was not performed with this format.  I connected with Cristian Sellers on 07/29/21 at 12:40 pm  by telephone and verified that I am speaking with the correct person using two identifiers. Cristian L Gad is currently located at home during visit. The provider, Daryll Drown, NP is located in their office at time of visit.  I discussed the limitations, risks, security and privacy concerns of performing an evaluation and management service by telephone and the availability of in person appointments. I also discussed with the patient that there may be a patient responsible charge related to this service. The patient expressed understanding and agreed to proceed.   History and Present Illness:  URI  This is a new problem. Episode onset: in the past 3 days. The problem has been unchanged. The maximum temperature recorded prior to his arrival was 100.4 - 100.9 F. Associated symptoms include congestion, coughing, headaches, a rash and rhinorrhea. Pertinent negatives include no nausea or sinus pain. He has tried nothing for the symptoms.  Fever  This is a new problem. The current episode started yesterday. The problem occurs constantly. The maximum temperature noted was 100 to 100.9 F. Associated symptoms include congestion, coughing, headaches and a rash. Pertinent negatives include no nausea. He has tried nothing for the symptoms.     Review of Systems  Constitutional:  Positive for fever. Negative for chills, malaise/fatigue and weight loss.  HENT:  Positive for congestion and rhinorrhea. Negative  for sinus pain.   Respiratory:  Positive for cough.   Gastrointestinal:  Negative for nausea.  Skin:  Positive for rash.  Neurological:  Positive for headaches.  All other systems reviewed and are negative.   Observations/Objective: Televisit patient not in distress.  Assessment and Plan: Take meds as prescribed - Use a cool mist humidifier  -Use saline nose sprays frequently -Force fluids -For fever or aches or pains- take Tylenol or ibuprofen. -At home COVID test negative  -Completed PCR/flu swab results pending.   -Bromfed Rx sent to pharmacy for cold symptoms, rhinorrhea and cough.  T Follow up with worsening unresolved symptoms   Follow Up Instructions: Follow-up with unresolved symptoms.    I discussed the assessment and treatment plan with the patient. The patient was provided an opportunity to ask questions and all were answered. The patient agreed with the plan and demonstrated an understanding of the instructions.   The patient was advised to call back or seek an in-person evaluation if the symptoms worsen or if the condition fails to improve as anticipated.  The above assessment and management plan was discussed with the patient. The patient verbalized understanding of and has agreed to the management plan. Patient is aware to call the clinic if symptoms persist or worsen. Patient is aware when to return to the clinic for a follow-up visit. Patient educated on when it is appropriate to go to the emergency department.   Time call ended:  12:50 pm   I provided 10 minutes of  non face-to-face time during this encounter.    Daryll Drown, NP

## 2021-07-30 LAB — NOVEL CORONAVIRUS, NAA: SARS-CoV-2, NAA: NOT DETECTED

## 2021-07-30 LAB — SARS-COV-2, NAA 2 DAY TAT

## 2021-08-03 ENCOUNTER — Encounter: Payer: Self-pay | Admitting: Nurse Practitioner

## 2021-08-03 ENCOUNTER — Other Ambulatory Visit: Payer: Self-pay | Admitting: Nurse Practitioner

## 2021-08-03 DIAGNOSIS — J018 Other acute sinusitis: Secondary | ICD-10-CM

## 2021-08-03 MED ORDER — AZITHROMYCIN 250 MG PO TABS: ORAL_TABLET | ORAL | 0 refills | Status: AC

## 2021-10-27 ENCOUNTER — Telehealth: Payer: BC Managed Care – PPO | Admitting: Family Medicine

## 2021-10-27 ENCOUNTER — Encounter: Payer: Self-pay | Admitting: Family Medicine

## 2021-10-27 DIAGNOSIS — L0291 Cutaneous abscess, unspecified: Secondary | ICD-10-CM | POA: Diagnosis not present

## 2021-10-27 MED ORDER — DOXYCYCLINE HYCLATE 100 MG PO TABS: 100.0000 mg | ORAL_TABLET | Freq: Two times a day (BID) | ORAL | 0 refills | Status: AC

## 2021-10-27 NOTE — Progress Notes (Signed)
? ?  Virtual Visit  Note ?Due to COVID-19 pandemic this visit was conducted virtually. This visit type was conducted due to national recommendations for restrictions regarding the COVID-19 Pandemic (e.g. social distancing, sheltering in place) in an effort to limit this patient's exposure and mitigate transmission in our community. All issues noted in this document were discussed and addressed.  A physical exam was not performed with this format. ? ?I connected with Cristian Sellers on 10/27/21 at 1505 by telephone and verified that I am speaking with the correct person using two identifiers. Cristian Sellers is currently located at home and no one is currently with him during visit. The provider, Gabriel Earing, FNP is located in their office at time of visit. ? ?I discussed the limitations, risks, security and privacy concerns of performing an evaluation and management service by telephone and the availability of in person appointments. I also discussed with the patient that there may be a patient responsible charge related to this service. The patient expressed understanding and agreed to proceed. ? ?CC: abscess ? ?History and Present Illness: ? ?HPI ?Cristian Sellers reports an abscess on his knee. It started 3 days ago and looked with a white head. He did pop it and drained it. When he woke up today the area was much larger, tender, and the skin all around it is red and warmth. There is purulent drainage. Denies fever. He is able to move his knee but it is tender. He has a history of abscesses.  ? ? ? ?ROS ?As per HPI.  ? ?Observations/Objective: ?Alert and oriented x 3. Able to speak in full sentences without difficulty.  ? ?Assessment and Plan: ?Cristian was seen today for abscess. ? ?Diagnoses and all orders for this visit: ? ?Abscess ?With surrounding cellulitis. Doxy as below. Warm compresses. Strict return precautions given.  ?-     doxycycline (VIBRA-TABS) 100 MG tablet; Take 1 tablet (100 mg total) by mouth 2 (two) times  daily for 10 days. 1 po bid ? ? ? ? ?Follow Up Instructions: ?Return to office for new or worsening symptoms, or if symptoms persist.  ? ?  ?I discussed the assessment and treatment plan with the patient. The patient was provided an opportunity to ask questions and all were answered. The patient agreed with the plan and demonstrated an understanding of the instructions. ?  ?The patient was advised to call back or seek an in-person evaluation if the symptoms worsen or if the condition fails to improve as anticipated. ? ?The above assessment and management plan was discussed with the patient. The patient verbalized understanding of and has agreed to the management plan. Patient is aware to call the clinic if symptoms persist or worsen. Patient is aware when to return to the clinic for a follow-up visit. Patient educated on when it is appropriate to go to the emergency department.  ? ?Time call ended:  1517 ? ?I provided 12 minutes of  non face-to-face time during this encounter. ? ? ? ?Gabriel Earing, FNP ? ? ?

## 2022-06-24 ENCOUNTER — Telehealth: Payer: Self-pay | Admitting: Physician Assistant

## 2022-06-24 DIAGNOSIS — J069 Acute upper respiratory infection, unspecified: Secondary | ICD-10-CM

## 2022-06-24 MED ORDER — IPRATROPIUM BROMIDE 0.03 % NA SOLN: 2.0000 | Freq: Two times a day (BID) | NASAL | 0 refills | Status: DC

## 2022-06-24 MED ORDER — BENZONATATE 100 MG PO CAPS: 100.0000 mg | ORAL_CAPSULE | Freq: Three times a day (TID) | ORAL | 0 refills | Status: DC | PRN

## 2022-06-24 NOTE — Progress Notes (Signed)
E-Visit for Upper Respiratory Infection   We are sorry you are not feeling well.  Here is how we plan to help!  Based on what you have shared with me, it looks like you may have a viral upper respiratory infection.  Upper respiratory infections are caused by a large number of viruses; however, rhinovirus is the most common cause.   Symptoms vary from person to person, with common symptoms including sore throat, cough, fatigue or lack of energy and feeling of general discomfort.  A low-grade fever of up to 100.4 may present, but is often uncommon.  Symptoms vary however, and are closely related to a person's age or underlying illnesses.  The most common symptoms associated with an upper respiratory infection are nasal discharge or congestion, cough, sneezing, headache and pressure in the ears and face.  These symptoms usually persist for about 3 to 10 days, but can last up to 2 weeks.  It is important to know that upper respiratory infections do not cause serious illness or complications in most cases.    Upper respiratory infections can be transmitted from person to person, with the most common method of transmission being a person's hands.  The virus is able to live on the skin and can infect other persons for up to 2 hours after direct contact.  Also, these can be transmitted when someone coughs or sneezes; thus, it is important to cover the mouth to reduce this risk.  To keep the spread of the illness at bay, good hand hygiene is very important.  This is an infection that is most likely caused by a virus. There are no specific treatments other than to help you with the symptoms until the infection runs its course.  We are sorry you are not feeling well.  Here is how we plan to help!   For nasal congestion, you may use an oral decongestants such as Mucinex D or if you have glaucoma or high blood pressure use plain Mucinex.  Saline nasal spray or nasal drops can help and can safely be used as often as  needed for congestion.  For your congestion, I have prescribed Ipratropium Bromide nasal spray 0.03% two sprays in each nostril 2-3 times a day  If you do not have a history of heart disease, hypertension, diabetes or thyroid disease, prostate/bladder issues or glaucoma, you may also use Sudafed to treat nasal congestion.  It is highly recommended that you consult with a pharmacist or your primary care physician to ensure this medication is safe for you to take.     If you have a cough, you may use cough suppressants such as Delsym and Robitussin.  If you have glaucoma or high blood pressure, you can also use Coricidin HBP.   For cough I have prescribed for you A prescription cough medication called Tessalon Perles 100 mg. You may take 1-2 capsules every 8 hours as needed for cough  If you have a sore or scratchy throat, use a saltwater gargle-  to  teaspoon of salt dissolved in a 4-ounce to 8-ounce glass of warm water.  Gargle the solution for approximately 15-30 seconds and then spit.  It is important not to swallow the solution.  You can also use throat lozenges/cough drops and Chloraseptic spray to help with throat pain or discomfort.  Warm or cold liquids can also be helpful in relieving throat pain.  For headache, pain or general discomfort, you can use Ibuprofen or Tylenol as directed.     Some authorities believe that zinc sprays or the use of Echinacea may shorten the course of your symptoms.   HOME CARE Only take medications as instructed by your medical team. Be sure to drink plenty of fluids. Water is fine as well as fruit juices, sodas and electrolyte beverages. You may want to stay away from caffeine or alcohol. If you are nauseated, try taking small sips of liquids. How do you know if you are getting enough fluid? Your urine should be a pale yellow or almost colorless. Get rest. Taking a steamy shower or using a humidifier may help nasal congestion and ease sore throat pain. You can  place a towel over your head and breathe in the steam from hot water coming from a faucet. Using a saline nasal spray works much the same way. Cough drops, hard candies and sore throat lozenges may ease your cough. Avoid close contacts especially the very young and the elderly Cover your mouth if you cough or sneeze Always remember to wash your hands.   GET HELP RIGHT AWAY IF: You develop worsening fever. If your symptoms do not improve within 10 days You develop yellow or green discharge from your nose over 3 days. You have coughing fits You develop a severe head ache or visual changes. You develop shortness of breath, difficulty breathing or start having chest pain Your symptoms persist after you have completed your treatment plan  MAKE SURE YOU  Understand these instructions. Will watch your condition. Will get help right away if you are not doing well or get worse.  Thank you for choosing an e-visit.  Your e-visit answers were reviewed by a board certified advanced clinical practitioner to complete your personal care plan. Depending upon the condition, your plan could have included both over the counter or prescription medications.  Please review your pharmacy choice. Make sure the pharmacy is open so you can pick up prescription now. If there is a problem, you may contact your provider through MyChart messaging and have the prescription routed to another pharmacy.  Your safety is important to us. If you have drug allergies check your prescription carefully.   For the next 24 hours you can use MyChart to ask questions about today's visit, request a non-urgent call back, or ask for a work or school excuse. You will get an email in the next two days asking about your experience. I hope that your e-visit has been valuable and will speed your recovery.  I have spent 5 minutes in review of e-visit questionnaire, review and updating patient chart, medical decision making and response to  patient.   Shanedra Lave M Sharrod Achille, PA-C  

## 2022-06-24 NOTE — Addendum Note (Signed)
Addended by: Margaretann Loveless on: 06/24/2022 01:08 PM   Modules accepted: Orders

## 2022-08-24 ENCOUNTER — Encounter: Payer: Self-pay | Admitting: Family

## 2022-08-24 ENCOUNTER — Ambulatory Visit: Payer: BC Managed Care – PPO | Admitting: Family

## 2022-08-24 VITALS — BP 122/87 | HR 56 | Temp 97.7°F | Ht 73.0 in | Wt 252.8 lb

## 2022-08-24 DIAGNOSIS — R1084 Generalized abdominal pain: Secondary | ICD-10-CM

## 2022-08-24 DIAGNOSIS — J4521 Mild intermittent asthma with (acute) exacerbation: Secondary | ICD-10-CM

## 2022-08-24 DIAGNOSIS — Z0001 Encounter for general adult medical examination with abnormal findings: Secondary | ICD-10-CM | POA: Diagnosis not present

## 2022-08-24 DIAGNOSIS — K219 Gastro-esophageal reflux disease without esophagitis: Secondary | ICD-10-CM

## 2022-08-24 DIAGNOSIS — R5383 Other fatigue: Secondary | ICD-10-CM

## 2022-08-24 DIAGNOSIS — Z0000 Encounter for general adult medical examination without abnormal findings: Secondary | ICD-10-CM

## 2022-08-24 DIAGNOSIS — K760 Fatty (change of) liver, not elsewhere classified: Secondary | ICD-10-CM

## 2022-08-24 MED ORDER — FAMOTIDINE 20 MG PO TABS: 20.0000 mg | ORAL_TABLET | Freq: Two times a day (BID) | ORAL | 1 refills | Status: DC

## 2022-08-24 MED ORDER — PANTOPRAZOLE SODIUM 40 MG PO TBEC: 40.0000 mg | DELAYED_RELEASE_TABLET | Freq: Two times a day (BID) | ORAL | 1 refills | Status: DC

## 2022-08-24 NOTE — Progress Notes (Signed)
Subjective:    Patient ID: Cristian Sellers, male    DOB: [birth year], 27 y.o.   MRN: [id number]  Chief Complaint  Patient presents with   Annual Exam   Abdominal Pain    Referral to GI. Pain only after he eats had u/s ct scan years ago    PT presents to the office for CPE.   He is complaining of burning and cramping abdominal pain. He takes protonix 40 mg BID with mild relief. He has fatty liver. He rarely drinks alcohol, but eats "what he wants".   He is complaining of fatigue.  Abdominal Pain This is a new problem. The current episode started 1 to 4 weeks ago. The onset quality is gradual. The problem occurs intermittently. The pain is located in the generalized abdominal region. The pain is at a severity of 4/10. The pain is moderate. Associated symptoms include belching and nausea. His past medical history is significant for GERD.  Gastroesophageal Reflux He complains of abdominal pain, belching, heartburn and nausea. He reports no coughing, no dysphagia, no globus sensation or no wheezing. This is a chronic problem. The current episode started more than 1 year ago. The problem occurs occasionally. He has tried a PPI for the symptoms. The treatment provided mild relief.  Asthma There is no cough, shortness of breath or wheezing. The current episode started more than 1 year ago. The problem occurs intermittently. Associated symptoms include heartburn. His past medical history is significant for asthma.      Review of Systems  Respiratory:  Negative for cough, shortness of breath and wheezing.   Gastrointestinal:  Positive for abdominal pain, heartburn and nausea. Negative for dysphagia.  All other systems reviewed and are negative.      Objective:   Physical Exam Vitals reviewed.  Constitutional:      General: He is not in acute distress.    Appearance: He is well-developed.  HENT:     Head: Normocephalic.     Right Ear: External ear normal.     Left Ear: External ear  normal.  Eyes:     General:        Right eye: No discharge.        Left eye: No discharge.     Pupils: Pupils are equal, round, and reactive to light.  Neck:     Thyroid: No thyromegaly.  Cardiovascular:     Rate and Rhythm: Normal rate and regular rhythm.     Heart sounds: Normal heart sounds. No murmur heard. Pulmonary:     Effort: Pulmonary effort is normal. No respiratory distress.     Breath sounds: Normal breath sounds. No wheezing.  Abdominal:     General: Bowel sounds are normal. There is no distension.     Palpations: Abdomen is soft.     Tenderness: There is no abdominal tenderness.  Musculoskeletal:        General: No tenderness. Normal range of motion.     Cervical back: Normal range of motion and neck supple.  Skin:    General: Skin is warm and dry.     Findings: No erythema or rash.  Neurological:     Mental Status: He is alert and oriented to person, place, and time.     Cranial Nerves: No cranial nerve deficit.     Deep Tendon Reflexes: Reflexes are normal and symmetric.  Psychiatric:        Behavior: Behavior normal.        Thought  Content: Thought content normal.        Judgment: Judgment normal.     BP 122/87   Pulse (!) 56   Temp 97.7 F (36.5 C) (Temporal)   Ht 6' 1"$  (1.854 m)   Wt 252 lb 12.8 oz (114.7 kg)   SpO2 98%   BMI 33.35 kg/m        Assessment & Plan:  Cristian Sellers comes in today with chief complaint of Annual Exam and Abdominal Pain (Referral to GI. Pain only after he eats had u/s ct scan years ago )   Diagnosis and orders addressed:  1. Annual physical exam - CMP14+EGFR - Lipid panel - TSH - Testosterone,Free and Total; Future - Anemia Profile B  2. Mild intermittent asthma with acute exacerbation - CMP14+EGFR  3. Gastroesophageal reflux disease, unspecified whether esophagitis present Start Pepcid Referral to GI -Diet discussed- Avoid fried, spicy, citrus foods, caffeine and alcohol -Do not eat 2-3 hours before  bedtime -Encouraged small frequent meals -Avoid NSAID's - Ambulatory referral to Gastroenterology - pantoprazole (PROTONIX) 40 MG tablet; Take 1 tablet (40 mg total) by mouth 2 (two) times daily. TAKE ONE (1) TABLET EACH DAY  Dispense: 180 tablet; Refill: 1 - CMP14+EGFR - famotidine (PEPCID) 20 MG tablet; Take 1 tablet (20 mg total) by mouth 2 (two) times daily.  Dispense: 180 tablet; Refill: 1  4. Generalized abdominal pain - CMP14+EGFR  5. Other fatigue - CMP14+EGFR - TSH - Testosterone,Free and Total; Future - Anemia Profile B  6. Hepatic steatosis Low fat diet  - Ambulatory referral to Gastroenterology   Labs pending Health Maintenance reviewed Diet and exercise encouraged  Follow up plan: 6 months    Evelina Dun, FNP

## 2022-08-24 NOTE — Patient Instructions (Signed)
Gastroesophageal Reflux Disease, Adult Gastroesophageal reflux (GER) happens when acid from the stomach flows up into the tube that connects the mouth and the stomach (esophagus). Normally, food travels down the esophagus and stays in the stomach to be digested. However, when a person has GER, food and stomach acid sometimes move back up into the esophagus. If this becomes a more serious problem, the person may be diagnosed with a disease called gastroesophageal reflux disease (GERD). GERD occurs when the reflux: Happens often. Causes frequent or severe symptoms. Causes problems such as damage to the esophagus. When stomach acid comes in contact with the esophagus, the acid may cause inflammation in the esophagus. Over time, GERD may create small holes (ulcers) in the lining of the esophagus. What are the causes? This condition is caused by a problem with the muscle between the esophagus and the stomach (lower esophageal sphincter, or LES). Normally, the LES muscle closes after food passes through the esophagus to the stomach. When the LES is weakened or abnormal, it does not close properly, and that allows food and stomach acid to go back up into the esophagus. The LES can be weakened by certain dietary substances, medicines, and medical conditions, including: Tobacco use. Pregnancy. Having a hiatal hernia. Alcohol use. Certain foods and beverages, such as coffee, chocolate, onions, and peppermint. What increases the risk? You are more likely to develop this condition if you: Have an increased body weight. Have a connective tissue disorder. Take NSAIDs, such as ibuprofen. What are the signs or symptoms? Symptoms of this condition include: Heartburn. Difficult or painful swallowing and the feeling of having a lump in the throat. A bitter taste in the mouth. Bad breath and having a large amount of saliva. Having an upset or bloated stomach and belching. Chest pain. Different conditions can  cause chest pain. Make sure you see your health care provider if you experience chest pain. Shortness of breath or wheezing. Ongoing (chronic) cough or a nighttime cough. Wearing away of tooth enamel. Weight loss. How is this diagnosed? This condition may be diagnosed based on a medical history and a physical exam. To determine if you have mild or severe GERD, your health care provider may also monitor how you respond to treatment. You may also have tests, including: A test to examine your stomach and esophagus with a small camera (endoscopy). A test that measures the acidity level in your esophagus. A test that measures how much pressure is on your esophagus. A barium swallow or modified barium swallow test to show the shape, size, and functioning of your esophagus. How is this treated? Treatment for this condition may vary depending on how severe your symptoms are. Your health care provider may recommend: Changes to your diet. Medicine. Surgery. The goal of treatment is to help relieve your symptoms and to prevent complications. Follow these instructions at home: Eating and drinking  Follow a diet as recommended by your health care provider. This may involve avoiding foods and drinks such as: Coffee and tea, with or without caffeine. Drinks that contain alcohol. Energy drinks and sports drinks. Carbonated drinks or sodas. Chocolate and cocoa. Peppermint and mint flavorings. Garlic and onions. Horseradish. Spicy and acidic foods, including peppers, chili powder, curry powder, vinegar, hot sauces, and barbecue sauce. Citrus fruit juices and citrus fruits, such as oranges, lemons, and limes. Tomato-based foods, such as red sauce, chili, salsa, and pizza with red sauce. Fried and fatty foods, such as donuts, french fries, potato chips, and high-fat dressings.   High-fat meats, such as hot dogs and fatty cuts of red and white meats, such as rib eye steak, sausage, ham, and  bacon. High-fat dairy items, such as whole milk, butter, and cream cheese. Eat small, frequent meals instead of large meals. Avoid drinking large amounts of liquid with your meals. Avoid eating meals during the 2-3 hours before bedtime. Avoid lying down right after you eat. Do not exercise right after you eat. Lifestyle  Do not use any products that contain nicotine or tobacco. These products include cigarettes, chewing tobacco, and vaping devices, such as e-cigarettes. If you need help quitting, ask your health care provider. Try to reduce your stress by using methods such as yoga or meditation. If you need help reducing stress, ask your health care provider. If you are overweight, reduce your weight to an amount that is healthy for you. Ask your health care provider for guidance about a safe weight loss goal. General instructions Pay attention to any changes in your symptoms. Take over-the-counter and prescription medicines only as told by your health care provider. Do not take aspirin, ibuprofen, or other NSAIDs unless your health care provider told you to take these medicines. Wear loose-fitting clothing. Do not wear anything tight around your waist that causes pressure on your abdomen. Raise (elevate) the head of your bed about 6 inches (15 cm). You can use a wedge to do this. Avoid bending over if this makes your symptoms worse. Keep all follow-up visits. This is important. Contact a health care provider if: You have: New symptoms. Unexplained weight loss. Difficulty swallowing or it hurts to swallow. Wheezing or a persistent cough. A hoarse voice. Your symptoms do not improve with treatment. Get help right away if: You have sudden pain in your arms, neck, jaw, teeth, or back. You suddenly feel sweaty, dizzy, or light-headed. You have chest pain or shortness of breath. You vomit and the vomit is green, yellow, or black, or it looks like blood or coffee grounds. You faint. You  have stool that is red, bloody, or black. You cannot swallow, drink, or eat. These symptoms may represent a serious problem that is an emergency. Do not wait to see if the symptoms will go away. Get medical help right away. Call your local emergency services (911 in the [place] Do not drive yourself to the hospital. Summary Gastroesophageal reflux happens when acid from the stomach flows up into the esophagus. GERD is a disease in which the reflux happens often, causes frequent or severe symptoms, or causes problems such as damage to the esophagus. Treatment for this condition may vary depending on how severe your symptoms are. Your health care provider may recommend diet and lifestyle changes, medicine, or surgery. Contact a health care provider if you have new or worsening symptoms. Take over-the-counter and prescription medicines only as told by your health care provider. Do not take aspirin, ibuprofen, or other NSAIDs unless your health care provider told you to do so. Keep all follow-up visits as told by your health care provider. This is important. This information is not intended to replace advice given to you by your health care provider. Make sure you discuss any questions you have with your health care provider. Document Revised: 12/29/2019 Document Reviewed: 12/29/2019 Elsevier Patient Education  2023 Elsevier Inc.  

## 2022-08-25 ENCOUNTER — Other Ambulatory Visit: Payer: BC Managed Care – PPO

## 2022-08-25 DIAGNOSIS — Z0000 Encounter for general adult medical examination without abnormal findings: Secondary | ICD-10-CM

## 2022-08-25 DIAGNOSIS — R5383 Other fatigue: Secondary | ICD-10-CM

## 2022-08-25 MED ORDER — PANTOPRAZOLE SODIUM 40 MG PO TBEC: 40.0000 mg | DELAYED_RELEASE_TABLET | Freq: Two times a day (BID) | ORAL | 1 refills | Status: DC

## 2022-08-25 NOTE — Addendum Note (Signed)
Addended by: Antonietta Barcelona D on: 08/25/2022 08:05 AM   Modules accepted: Orders

## 2022-08-25 NOTE — Progress Notes (Signed)
Refill failed. Pharmacy was having issues receiving scripts, resent.

## 2022-08-26 LAB — ANEMIA PROFILE B
Basophils Absolute: 0 10*3/uL (ref 0.0–0.2)
Basos: 1 %
EOS (ABSOLUTE): 0.2 10*3/uL (ref 0.0–0.4)
Eos: 3 %
Ferritin: 228 ng/mL (ref 30–400)
Folate: 8.4 ng/mL (ref >3.0)
Hematocrit: 45.5 % (ref 37.5–51.0)
Hemoglobin: 15.9 g/dL (ref 13.0–17.7)
Immature Grans (Abs): 0 10*3/uL (ref 0.0–0.1)
Immature Granulocytes: 0 %
Iron Saturation: 19 % (ref 15–55)
Iron: 66 ug/dL (ref 38–169)
Lymphocytes Absolute: 1.7 10*3/uL (ref 0.7–3.1)
Lymphs: 23 %
MCH: 30.2 pg (ref 26.6–33.0)
MCHC: 34.9 g/dL (ref 31.5–35.7)
MCV: 87 fL (ref 79–97)
Monocytes Absolute: 0.3 10*3/uL (ref 0.1–0.9)
Monocytes: 5 %
Neutrophils Absolute: 5 10*3/uL (ref 1.4–7.0)
Neutrophils: 68 %
Platelets: 265 10*3/uL (ref 150–450)
RBC: 5.26 x10E6/uL (ref 4.14–5.80)
RDW: 12.1 % (ref 11.6–15.4)
Retic Ct Pct: 1.4 % (ref 0.6–2.6)
Total Iron Binding Capacity: 343 ug/dL (ref 250–450)
UIBC: 277 ug/dL (ref 111–343)
Vitamin B-12: 146 pg/mL — ABNORMAL LOW (ref 232–1245)
WBC: 7.3 10*3/uL (ref 3.4–10.8)

## 2022-08-26 LAB — CMP14+EGFR
ALT: 28 IU/L (ref 0–44)
AST: 17 IU/L (ref 0–40)
Albumin/Globulin Ratio: 2.2 (ref 1.2–2.2)
Albumin: 4.8 g/dL (ref 4.3–5.2)
Alkaline Phosphatase: 53 IU/L (ref 44–121)
BUN/Creatinine Ratio: 8 — ABNORMAL LOW (ref 9–20)
BUN: 9 mg/dL (ref 6–20)
Bilirubin Total: 1.2 mg/dL (ref 0.0–1.2)
CO2: 25 mmol/L (ref 20–29)
Calcium: 9.4 mg/dL (ref 8.7–10.2)
Chloride: 102 mmol/L (ref 96–106)
Creatinine, Ser: 1.14 mg/dL (ref 0.76–1.27)
Globulin, Total: 2.2 g/dL (ref 1.5–4.5)
Glucose: 86 mg/dL (ref 70–99)
Potassium: 4.3 mmol/L (ref 3.5–5.2)
Sodium: 139 mmol/L (ref 134–144)
Total Protein: 7 g/dL (ref 6.0–8.5)
eGFR: 91 mL/min/{1.73_m2} (ref >59)

## 2022-08-26 LAB — TSH: TSH: 2.49 u[IU]/mL (ref 0.450–4.500)

## 2022-08-26 LAB — LIPID PANEL
Chol/HDL Ratio: 2.6 ratio (ref 0.0–5.0)
Cholesterol, Total: 100 mg/dL (ref 100–199)
HDL: 38 mg/dL — ABNORMAL LOW (ref >39)
LDL Chol Calc (NIH): 48 mg/dL (ref 0–99)
Triglycerides: 61 mg/dL (ref 0–149)
VLDL Cholesterol Cal: 14 mg/dL (ref 5–40)

## 2022-08-31 LAB — TESTOSTERONE,FREE AND TOTAL
Testosterone, Free: 10.7 pg/mL (ref 9.3–26.5)
Testosterone: 306 ng/dL (ref 264–916)

## 2022-09-04 ENCOUNTER — Encounter: Payer: Self-pay | Admitting: Nurse Practitioner

## 2022-09-04 ENCOUNTER — Telehealth: Payer: BC Managed Care – PPO | Admitting: Nurse Practitioner

## 2022-09-04 DIAGNOSIS — R051 Acute cough: Secondary | ICD-10-CM | POA: Diagnosis not present

## 2022-09-04 DIAGNOSIS — R0981 Nasal congestion: Secondary | ICD-10-CM

## 2022-09-04 DIAGNOSIS — J3489 Other specified disorders of nose and nasal sinuses: Secondary | ICD-10-CM | POA: Diagnosis not present

## 2022-09-04 DIAGNOSIS — R6889 Other general symptoms and signs: Secondary | ICD-10-CM

## 2022-09-04 DIAGNOSIS — R509 Fever, unspecified: Secondary | ICD-10-CM | POA: Diagnosis not present

## 2022-09-04 MED ORDER — PREDNISONE 20 MG PO TABS: 40.0000 mg | ORAL_TABLET | Freq: Every day | ORAL | 0 refills | Status: AC

## 2022-09-04 MED ORDER — BENZONATATE 100 MG PO CAPS: 100.0000 mg | ORAL_CAPSULE | Freq: Three times a day (TID) | ORAL | 0 refills | Status: DC | PRN

## 2022-09-04 NOTE — Patient Instructions (Signed)
Cristian Sellers, thank you for joining Chevis Pretty, FNP for today's virtual visit.  While this provider is not your primary care provider (PCP), if your PCP is located in our provider database this encounter information will be shared with them immediately following your visit.   [facility] account gives you access to today's visit and all your visits, tests, and labs performed at [facility] " click here if you don't have a [facility] account or go to [email or web address]  Consent: (Patient) Cristian L Buswell provided verbal consent for this virtual visit at the beginning of the encounter.  Current Medications:  Current Outpatient Medications:    benzonatate (TESSALON PERLES) 100 MG capsule, Take 1 capsule (100 mg total) by mouth 3 (three) times daily as needed for cough., Disp: 20 capsule, Rfl: 0   predniSONE (DELTASONE) 20 MG tablet, Take 2 tablets (40 mg total) by mouth daily with breakfast for 5 days. 2 po daily for 5 days, Disp: 10 tablet, Rfl: 0   famotidine (PEPCID) 20 MG tablet, Take 1 tablet (20 mg total) by mouth 2 (two) times daily., Disp: 180 tablet, Rfl: 1   pantoprazole (PROTONIX) 40 MG tablet, Take 1 tablet (40 mg total) by mouth 2 (two) times daily. TAKE ONE (1) TABLET EACH DAY, Disp: 180 tablet, Rfl: 1   valACYclovir (VALTREX) 1000 MG tablet, TAKE ONE TABLET TWICE DAILY AS NEEDED (Patient not taking: Reported on 08/24/2022), Disp: 60 tablet, Rfl: 2   Medications ordered in this encounter:  Meds ordered this encounter  Medications   predniSONE (DELTASONE) 20 MG tablet    Sig: Take 2 tablets (40 mg total) by mouth daily with breakfast for 5 days. 2 po daily for 5 days    Dispense:  10 tablet    Refill:  0    Order Specific Question:   Supervising Provider    Answer:   Caryl Pina A [id number]   benzonatate (TESSALON PERLES) 100 MG capsule    Sig: Take 1 capsule (100 mg total) by mouth 3 (three) times daily as needed for cough.     Dispense:  20 capsule    Refill:  0    Order Specific Question:   Supervising Provider    Answer:   Caryl Pina A [id number]     *If you need refills on other medications prior to your next appointment, please contact your pharmacy*  Follow-Up: Call back or seek an in-person evaluation if the symptoms worsen or if the condition fails to improve as anticipated.  [place]  Other Instructions 1. Take meds as prescribed 2. Use a cool mist humidifier especially during the winter months and when heat has been humid. 3. Use saline nose sprays frequently 4. Saline irrigations of the nose can be very helpful if done frequently.  * 4X daily for 1 week*  * Use of a nettie pot can be helpful with this. Follow directions with this* 5. Drink plenty of fluids 6. Keep thermostat turn down low 7.For any cough or congestion- tessalon perles 8. For fever or aces or pains- take tylenol or ibuprofen appropriate for age and weight.  * for fevers greater than 101 orally you may alternate ibuprofen and tylenol every  3 hours.      If you have been instructed to have an in-person evaluation today at a local Urgent Care facility, please use the link below. It will take you to a list of all of our available [facility], including address, phone number and hours of operation. Please do not delay care.  [facility]  If you or a family member do not have a primary care provider, use the link below to schedule a visit and establish care. When you choose a [facility] primary care physician or advanced practice provider, you gain a long-term partner in health. Find a Primary Care Provider  Learn more about [facility] in-office and virtual care options: [facility]

## 2022-09-04 NOTE — Progress Notes (Signed)
Virtual Visit Consent   Cristian Sellers, you are scheduled for a virtual visit with Mary-Margaret Hassell Done, [place], a [facility] provider, today.     Just as with appointments in the office, your consent must be obtained to participate.  Your consent will be active for this visit and any virtual visit you may have with one of our providers in the next 365 days.     If you have a MyChart account, a copy of this consent can be sent to you electronically.  All virtual visits are billed to your insurance company just like a traditional visit in the office.    As this is a virtual visit, video technology does not allow for your provider to perform a traditional examination.  This may limit your provider's ability to fully assess your condition.  If your provider identifies any concerns that need to be evaluated in person or the need to arrange testing (such as labs, EKG, etc.), we will make arrangements to do so.     Although advances in technology are sophisticated, we cannot ensure that it will always work on either your end or our end.  If the connection with a video visit is poor, the visit may have to be switched to a telephone visit.  With either a video or telephone visit, we are not always able to ensure that we have a secure connection.     I need to obtain your verbal consent now.   Are you willing to proceed with your visit today? YES   Cristian L Coplen has provided verbal consent on 09/04/2022 for a virtual visit (video or telephone).   Mary-Margaret Hassell Done, FNP   Date: 09/04/2022 11:44 AM   Virtual Visit via Video Note   I, Mary-Margaret Hassell Done, connected with Cristian Sellers ([id number], [birth year]) on 09/04/22 at  5:45 PM EST by a video-enabled telemedicine application and verified that I am speaking with the correct person using two identifiers.  Location: Patient: Virtual Visit Location Patient: Home Provider: Virtual Visit Location Provider: Mobile   I discussed the limitations of  evaluation and management by telemedicine and the availability of in person appointments. The patient expressed understanding and agreed to proceed.    History of Present Illness: Cristian L Bora is a 27 y.o. who identifies as a male who was assigned male at birth, and is being seen today for flu like.  HPI: URI  This is a new problem. Episode onset: friday night. The problem has been waxing and waning. The maximum temperature recorded prior to his arrival was 100.4 - 100.9 F. The fever has been present for 1 to 2 days. Associated symptoms include congestion, coughing and rhinorrhea. Pertinent negatives include no sinus pain or sore throat. Associated symptoms comments: myalgia. He has tried acetaminophen and decongestant for the symptoms. The treatment provided mild relief.    Review of Systems  HENT:  Positive for congestion and rhinorrhea. Negative for sinus pain and sore throat.   Respiratory:  Positive for cough.     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 TAKE ONE (1) TABLET EACH DAY, Disp: 180 tablet, Rfl: 1   valACYclovir (VALTREX) 1000 MG tablet, TAKE ONE TABLET TWICE DAILY AS NEEDED (Patient not taking: Reported on 08/24/2022), Disp: 60 tablet, Rfl: 2  Observations/Objective: Patient is well-developed, well-nourished in no acute distress.  Resting comfortably  at home.  Head is normocephalic, atraumatic.  No labored breathing.  Speech is clear and coherent with logical content.  Patient is alert and oriented at baseline.  Raspy  voice Deep cough  Assessment and Plan:  Cristian L Dressel in today with chief complaint of flu like   1. Flu-like symptoms Out treatment window  2. Acute cough 1. Take meds as prescribed 2. Use a cool mist humidifier especially during the winter months and when heat has been humid. 3. Use saline nose sprays frequently 4. Saline irrigations of the nose can be very helpful if done frequently.  * 4X daily for 1 week*  * Use of a nettie pot can be helpful with this. Follow directions with this* 5. Drink plenty of fluids 6. Keep thermostat turn down low 7.For any cough or congestion- tessalon perles 8. For fever or aces or pains- take tylenol or ibuprofen appropriate for age and weight.  * for fevers greater than 101 orally you may alternate ibuprofen and tylenol every  3 hours.    - predniSONE (DELTASONE) 20 MG tablet; Take 2 tablets (40 mg total) by mouth daily with breakfast for 5 days. 2 po daily for 5 days  Dispense: 10 tablet; Refill: 0 - benzonatate (TESSALON PERLES) 100 MG capsule; Take 1 capsule (100 mg total) by mouth 3 (three) times daily as needed for cough.  Dispense: 20 capsule; Refill: 0    Follow Up Instructions: I discussed the assessment and treatment plan with the patient. The patient was provided an opportunity to ask questions and all were answered. The patient agreed with the plan and demonstrated an understanding of the instructions.  A copy of instructions were sent to the patient via MyChart.  The patient was advised to call back or seek an in-person evaluation if the symptoms worsen or if the condition fails to improve as anticipated.  Time:  I spent 7 minutes with the patient via telehealth technology discussing the above problems/concerns.    Mary-Margaret Hassell Done, FNP

## 2022-09-08 ENCOUNTER — Encounter: Payer: Self-pay | Admitting: Internal Medicine

## 2022-09-08 ENCOUNTER — Telehealth: Payer: Self-pay | Admitting: Family

## 2022-09-08 DIAGNOSIS — E538 Deficiency of other specified B group vitamins: Secondary | ICD-10-CM

## 2022-09-08 MED ORDER — CYANOCOBALAMIN 1000 MCG/ML IJ SOLN: INTRAMUSCULAR | 1 refills | Status: AC

## 2022-09-08 NOTE — Telephone Encounter (Signed)
Vit B 12 rx sent to pharmacy, take 1000 mcg [place]/IM every day for one week, then 1000 mcg once every week for one month. Then 1000 mcg monthly.

## 2022-09-08 NOTE — Telephone Encounter (Signed)
Vit b12 is lower range wants rx for injections sent in to the drug store

## 2022-09-08 NOTE — Telephone Encounter (Signed)
Patient aware and verbalized understanding. °

## 2022-09-26 ENCOUNTER — Ambulatory Visit: Payer: BC Managed Care – PPO | Admitting: Internal Medicine

## 2022-09-26 ENCOUNTER — Encounter: Payer: Self-pay | Admitting: Internal Medicine

## 2022-09-26 ENCOUNTER — Other Ambulatory Visit (INDEPENDENT_AMBULATORY_CARE_PROVIDER_SITE_OTHER): Payer: BC Managed Care – PPO

## 2022-09-26 VITALS — BP 120/70 | HR 76 | Ht 72.0 in | Wt 239.4 lb

## 2022-09-26 DIAGNOSIS — R112 Nausea with vomiting, unspecified: Secondary | ICD-10-CM

## 2022-09-26 DIAGNOSIS — K219 Gastro-esophageal reflux disease without esophagitis: Secondary | ICD-10-CM

## 2022-09-26 DIAGNOSIS — R195 Other fecal abnormalities: Secondary | ICD-10-CM

## 2022-09-26 DIAGNOSIS — R1030 Lower abdominal pain, unspecified: Secondary | ICD-10-CM

## 2022-09-26 DIAGNOSIS — R634 Abnormal weight loss: Secondary | ICD-10-CM

## 2022-09-26 DIAGNOSIS — R1013 Epigastric pain: Secondary | ICD-10-CM | POA: Diagnosis not present

## 2022-09-26 DIAGNOSIS — R7989 Other specified abnormal findings of blood chemistry: Secondary | ICD-10-CM

## 2022-09-26 LAB — HIGH SENSITIVITY CRP: CRP, High Sensitivity: 4.93 mg/L (ref 0.000–5.000)

## 2022-09-26 LAB — LIPASE: Lipase: 27 U/L (ref 11.0–59.0)

## 2022-09-26 MED ORDER — DICYCLOMINE HCL 10 MG PO CAPS: 10.0000 mg | ORAL_CAPSULE | Freq: Three times a day (TID) | ORAL | 0 refills | Status: DC | PRN

## 2022-09-26 NOTE — Patient Instructions (Signed)
You have been scheduled for an abdominal ultrasound at [facility] (1st floor of hospital) on 10/06/22 at 9:30 am. Please arrive 30 minutes prior to your appointment for registration. Make certain not to have anything to eat or drink after midnight appointment. Should you need to reschedule your appointment, please contact radiology at [phone]. This test typically takes about 30 minutes to perform.   Your provider has requested that you go to the basement level for lab work before leaving today. Press "B" on the elevator. The lab is located at the first door on the left as you exit the elevator.   You have been scheduled for an endoscopy. Please follow written instructions given to you at your visit today. If you use inhalers (even only as needed), please bring them with you on the day of your procedure.   We have sent the following medications to your pharmacy for you to pick up at your convenience: Bentyl  Continue pantoprazole   If your blood pressure at your visit was 140/90 or greater, please contact your primary care physician to follow up on this.    If you are age 27 or older, your body mass index should be between 23-30. Your Body mass index is 32.47 kg/m. If this is out of the aforementioned range listed, please consider follow up with your Primary Care Provider.  If you are age 27 or younger, your body mass index should be between 19-25. Your Body mass index is 32.47 kg/m. If this is out of the aformentioned range listed, please consider follow up with your Primary Care Provider.   ________________________________________________________  The [facility] providers would like to encourage you to use [facility] to communicate with providers for non-urgent requests or questions.  Due to long hold times on the telephone, sending your provider a message by [facility] may be a faster and more efficient way to get a response.  Please allow 48 business hours for a response.  Please  remember that this is for non-urgent requests.  _______________________________________________________   Due to recent changes in healthcare laws, you may see the results of your imaging and laboratory studies on MyChart before your provider has had a chance to review them.  We understand that in some cases there may be results that are confusing or concerning to you. Not all laboratory results come back in the same time frame and the provider may be waiting for multiple results in order to interpret others.  Please give [place] 48 hours in order for your provider to thoroughly review all the results before contacting the office for clarification of your results.    Thank you for entrusting me with your care and for choosing [facility], Dr. Christia Reading

## 2022-09-26 NOTE — Progress Notes (Signed)
Chief Complaint: GERD  HPI : 27 year old male with history of obesity, GERD, and asthma presents with GERD  Even in back in middle school, he had a lot of stomach problems. Two years ago he went to out to a restaurant and felt fine afterwards. Then that night he had terrible N&V and diarrhea. Eventually this went away but then came back 2 weeks later when after going to the same restaurant but eating different foods. He started developing shooting abdominal pains afterwards. This pain is located either in the upper or lower abdomen. This pain went away for a while, but then a few months ago, he started developing ab pain again. When he eats, he doesn't feel good. Foods tend to just sit in his stomach. If he eats something cold, this seems to help with his symptoms. He usually takes pantoprazole 40 mg QD but sometimes take it BID to try to help with his pain. He does not take Pepcid. Endorses bloating. Endorses belching all day. Endorses regurgitation on occasion. Denies chest burning. Denies dysphagia. Denies odynophagia. Last time he had N&V was about 3 weeks ago. He quit drinking sodas to see if this helps. He still has an appetite but he knows that there were be consequences in terms of GI symptoms if he eats. He has on average 1-2 BMs per day. He has loose stools. Denies blood in stools or fevers. He does not restrict in his diet unless he has a flare up of his symptoms. Denies alcohol use. Denies marijuana. He does vape, which seems to make his symptoms worse. He has lost about 10-16 lbs over the last month. Grandmother died at 15 year old of colon cancer. Uncle has bile duct cancer. Denies other GI issues in the family. Denies headache. Denies NSAID use. Denies prior EGD or colonoscopy. He works in Theatre manager for [employer].  Wt Readings from Last 3 Encounters:  09/26/22 239 lb 6 oz (108.6 kg)  08/24/22 252 lb 12.8 oz (114.7 kg)  01/18/21 256 lb (116.1 kg)   Past Medical History:   Diagnosis Date   Arthritis    Asthma    GERD (gastroesophageal reflux disease)    Obesity    Past Surgical History:  Procedure Laterality Date   FINGER SURGERY Left    ring finger   TONSILLECTOMY     Family History  Problem Relation Age of Onset   Anxiety disorder Mother    Hypothyroidism Father    Arthritis Father    Thyroid disease Father    Diabetes Sister    Colon cancer Maternal Grandmother 37   Breast cancer Paternal Grandmother    Cancer Paternal Uncle        Bile duct   Social History   Tobacco Use   Smoking status: Never   Smokeless tobacco: Never  Vaping Use   Vaping Use: Every day  Substance Use Topics   Alcohol use: Yes    Comment: 2 beers a month   Drug use: No   Current Outpatient Medications  Medication Sig Dispense Refill   cyanocobalamin (VITAMIN B12) 1000 MCG/ML injection 1000 mcg [place]/IM every day for one week, then 1000 mcg once every week for one month. Then 1000 mcg monthly. 30 mL 1   pantoprazole (PROTONIX) 40 MG tablet Take 1 tablet (40 mg total) by mouth 2 (two) times daily. TAKE ONE (1) TABLET EACH DAY 180 tablet 1   valACYclovir (VALTREX) 1000 MG tablet TAKE ONE TABLET TWICE DAILY  AS NEEDED 60 tablet 2   famotidine (PEPCID) 20 MG tablet Take 1 tablet (20 mg total) by mouth 2 (two) times daily. (Patient not taking: Reported on 09/26/2022) 180 tablet 1   No current facility-administered medications for this visit.   No Known Allergies   Review of Systems: All systems reviewed and negative except where noted in HPI.   Physical Exam: BP 120/70 (BP Location: Left Arm, Patient Position: Sitting, Cuff Size: Large)   Pulse 76   Ht 6' (1.829 m)   Wt 239 lb 6 oz (108.6 kg)   BMI 32.47 kg/m  Constitutional: Pleasant,well-developed, male in no acute distress. HEENT: Normocephalic and atraumatic. Conjunctivae are normal. No scleral icterus. Cardiovascular: Normal rate, regular rhythm.  Pulmonary/chest: Effort normal and breath sounds normal.  No wheezing, rales or rhonchi. Abdominal: Soft, nondistended, mildly tender in the upper and lower abdomen. Bowel sounds active throughout. There are no masses palpable. No hepatomegaly. Extremities: No edema Neurological: Alert and oriented to person place and time. Skin: Skin is warm and dry. No rashes noted. Psychiatric: Normal mood and affect. Behavior is normal.  Labs 09/2019: CMP with mildly elevated ALT of 59.   Labs 08/2022: CBC nml. CMP with nml LFTs. TSH nml. Vitamin B12 low at 146.  CT Abdomen w/contrast 10/07/19: IMPRESSION: 1. No acute findings or explanation for the patient's symptoms. 2. Subjective mild hepatic steatosis. 3. Bilateral L4 pars fractures, likely nonacute.  ASSESSMENT AND PLAN: GERD N&V Epigastric and lower abdominal pain Loose stools Weight loss Vitamin B12 deficiency Patient presents with GERD, N&V, epigastric and lower abdominal pain, loose stools, and weight loss. These symptoms flare up on occasion and are inconsistently controlled on daily PPI therapy. He was recently diagnosed with vitamin B12 deficiency so he may have an underlying malabsorption disorder. Will plan for further work up with labs, RUQ U/S, and EGD. For symptom management, he can continue daily PPI and add on Bentyl 10 mg BID PRN for ab pain. I also went over conservative strategies to help with GERD control.  - GERD handout - Continue to avoid lactose - Cont pantoprazole 40 mg QD - Bentyl 10 mg TID PRN for abdominal pain - Check lipase, CRP, TTG IgA, IgA, alpha gal syndrome - RUQ U/S - EGD [facility]  Christia Reading, MD  I spent 60 minutes of time, including in depth chart review, independent review of results as outlined above, communicating results with the patient directly, face-to-face time with the patient, coordinating care, ordering studies and medications as appropriate, and documentation.

## 2022-09-29 LAB — ALPHA-GAL PANEL
Allergen, Mutton, f88: <0.1 kU/L
Allergen, Pork, f26: <0.1 kU/L
Beef: <0.1 kU/L
CLASS: 0
CLASS: 0
Class: 0
GALACTOSE-ALPHA-1,3-GALACTOSE IGE*: <0.1 kU/L (ref <0.10)

## 2022-09-29 LAB — INTERPRETATION:

## 2022-09-29 LAB — IGA: Immunoglobulin A: 163 mg/dL (ref 47–310)

## 2022-09-29 LAB — TISSUE TRANSGLUTAMINASE, IGA: (tTG) Ab, IgA: <1 U/mL

## 2022-10-06 ENCOUNTER — Ambulatory Visit (HOSPITAL_COMMUNITY)
Admission: RE | Admit: 2022-10-06 | Discharge: 2022-10-06 | Disposition: A | Discharge status: Home / Self Care | Payer: BC Managed Care – PPO | Source: Ambulatory Visit | Attending: Internal Medicine | Admitting: Internal Medicine

## 2022-10-06 DIAGNOSIS — R1030 Lower abdominal pain, unspecified: Secondary | ICD-10-CM | POA: Insufficient documentation

## 2022-10-06 DIAGNOSIS — R634 Abnormal weight loss: Secondary | ICD-10-CM | POA: Insufficient documentation

## 2022-10-06 DIAGNOSIS — R1013 Epigastric pain: Secondary | ICD-10-CM | POA: Insufficient documentation

## 2022-10-06 DIAGNOSIS — R112 Nausea with vomiting, unspecified: Secondary | ICD-10-CM | POA: Diagnosis present

## 2022-10-06 DIAGNOSIS — K219 Gastro-esophageal reflux disease without esophagitis: Secondary | ICD-10-CM | POA: Diagnosis not present

## 2022-10-06 DIAGNOSIS — R195 Other fecal abnormalities: Secondary | ICD-10-CM | POA: Diagnosis present

## 2022-11-20 ENCOUNTER — Encounter: Payer: Self-pay | Admitting: Internal Medicine

## 2022-11-22 ENCOUNTER — Ambulatory Visit (AMBULATORY_SURGERY_CENTER): Payer: BC Managed Care – PPO | Admitting: Internal Medicine

## 2022-11-22 ENCOUNTER — Telehealth: Payer: Self-pay

## 2022-11-22 ENCOUNTER — Encounter: Payer: Self-pay | Admitting: Internal Medicine

## 2022-11-22 VITALS — BP 101/51 | HR 60 | Temp 98.6°F | Resp 16 | Ht 72.0 in | Wt 239.0 lb

## 2022-11-22 DIAGNOSIS — K219 Gastro-esophageal reflux disease without esophagitis: Secondary | ICD-10-CM

## 2022-11-22 DIAGNOSIS — R1013 Epigastric pain: Secondary | ICD-10-CM

## 2022-11-22 DIAGNOSIS — K317 Polyp of stomach and duodenum: Secondary | ICD-10-CM | POA: Diagnosis present

## 2022-11-22 MED ORDER — SODIUM CHLORIDE 0.9 % IV SOLN: 500.0000 mL | Freq: Once | INTRAVENOUS | Status: DC

## 2022-11-22 NOTE — Progress Notes (Signed)
Vss nad trans to pacu 

## 2022-11-22 NOTE — Telephone Encounter (Signed)
-----   Message from Mady Haagensen, RN sent at 11/22/2022  3:41 PM EDT ----- Regarding: FW: need appointment Beth I sent this to the wrong person this morning. Can you help with this appointment? I looked at our newest phone list and it does not look like Dr. Leonides Schanz has a CMA to send this to. Thanks so much for your help!  Jess ----- Message ----- From: Marlowe Kays, CMA Sent: 11/22/2022   3:03 PM EDT To: Mady Haagensen, RN Subject: RE: need appointment                           I dont work with Dr Derek Mound schedule I work with Dr Lavon Paganini is that who you mean?   ----- Message ----- From: Mady Haagensen, RN Sent: 11/22/2022  10:10 AM EDT To: Marlowe Kays, CMA Subject: need appointment                               Ivin Poot! I was trying to schedule a 4-6 week f/u for this pt and all appointments have "7 day hold" on them. Can you please schedule an appointment with Dr. Leonides Schanz or APP? The pt will be out of town the week of 12/16/22-12/23/22. Thanks for your help!  Jess

## 2022-11-22 NOTE — Progress Notes (Signed)
VS completed by CW   Pt's states no medical or surgical changes since previsit or office visit.  

## 2022-11-22 NOTE — Op Note (Signed)
[facility] Patient Name: Cristian Sellers Procedure Date: 11/22/2022 9:11 AM MRN: [id number] Endoscopist: Particia Lather , , [id number] Age: 27 Referring MD:  Date of Birth: [birth year] Gender: Male Account #: [id number] Procedure:                Upper GI endoscopy Indications:              Epigastric abdominal pain, Heartburn, Nausea with                            vomiting, Weight loss Medicines:                Monitored Anesthesia Care Procedure:                Pre-Anesthesia Assessment:                           - Prior to the procedure, a History and Physical                            was performed, and patient medications and                            allergies were reviewed. The patient's tolerance of                            previous anesthesia was also reviewed. The risks                            and benefits of the procedure and the sedation                            options and risks were discussed with the patient.                            All questions were answered, and informed consent                            was obtained. Prior Anticoagulants: The patient has                            taken no anticoagulant or antiplatelet agents. ASA                            Grade Assessment: II - A patient with mild systemic                            disease. After reviewing the risks and benefits,                            the patient was deemed in satisfactory condition to                            undergo the procedure.  After obtaining informed consent, the endoscope was                            passed under direct vision. Throughout the                            procedure, the patient's blood pressure, pulse, and                            oxygen saturations were monitored continuously. The                            Olympus Scope [id number] was introduced through the                            mouth, and advanced to the  second part of duodenum.                            The upper GI endoscopy was accomplished without                            difficulty. The patient tolerated the procedure                            well. Scope In: Scope Out: Findings:                 The examined esophagus was normal. Biopsies were                            taken with a cold forceps for histology.                           A few fundic gland polyps with no bleeding and no                            stigmata of recent bleeding were found in the                            gastric body.                           Localized mildly erythematous mucosa without                            bleeding was found in the gastric antrum. Biopsies                            were taken with a cold forceps for histology.                           The examined duodenum was normal. Biopsies for                            histology were taken with  a cold forceps for                            evaluation of celiac disease. Complications:            No immediate complications. Estimated Blood Loss:     Estimated blood loss was minimal. Impression:               - Normal esophagus. Biopsied.                           - A few fundic gland gastric polyps.                           - Erythematous mucosa in the antrum. Biopsied.                           - Normal examined duodenum. Biopsied. Recommendation:           - Discharge patient to home (with escort).                           - Await pathology results.                           - Return to GI clinic in 4-6 weeks.                           - The findings and recommendations were discussed                            with the patient. Dr Particia Lather "[place]" [place],  11/22/2022 9:46:52 AM

## 2022-11-22 NOTE — Progress Notes (Signed)
GASTROENTEROLOGY PROCEDURE H&P NOTE   Primary Care Physician: Junie Spencer, FNP    Reason for Procedure:   GERD, N&V, epigastric ab pain, weight loss  Plan:    EGD  Patient is appropriate for endoscopic procedure(s) in the ambulatory ([facility]) setting.  The nature of the procedure, as well as the risks, benefits, and alternatives were carefully and thoroughly reviewed with the patient. Ample time for discussion and questions allowed. The patient understood, was satisfied, and agreed to proceed.     HPI: Cristian Sellers is a 27 y.o. male who presents for EGD for evaluation of GERD, N&V, epigastric ab pain, and weight loss .  Patient was most recently seen in the Gastroenterology Clinic on 09/26/22.  No interval change in medical history since that appointment. Please refer to that note for full details regarding GI history and clinical presentation.   Past Medical History:  Diagnosis Date   Arthritis    Asthma    GERD (gastroesophageal reflux disease)    Obesity     Past Surgical History:  Procedure Laterality Date   FINGER SURGERY Left    ring finger   TONSILLECTOMY      Prior to Admission medications   Medication Sig Start Date End Date Taking? Authorizing Provider  dicyclomine (BENTYL) 10 MG capsule Take 1 capsule (10 mg total) by mouth 3 (three) times daily as needed for spasms. 09/26/22 11/22/22 Yes Imogene Burn, MD  pantoprazole (PROTONIX) 40 MG tablet Take 1 tablet (40 mg total) by mouth 2 (two) times daily. TAKE ONE (1) TABLET EACH DAY 08/25/22  Yes Hawks, Christy A, FNP  cyanocobalamin (VITAMIN B12) 1000 MCG/ML injection 1000 mcg [place]/IM every day for one week, then 1000 mcg once every week for one month. Then 1000 mcg monthly. 09/08/22   Junie Spencer, FNP  famotidine (PEPCID) 20 MG tablet Take 1 tablet (20 mg total) by mouth 2 (two) times daily. Patient not taking: Reported on 09/26/2022 08/24/22   Junie Spencer, FNP  valACYclovir (VALTREX) 1000 MG tablet TAKE ONE  TABLET TWICE DAILY AS NEEDED 06/10/21   Junie Spencer, FNP    Current Outpatient Medications  Medication Sig Dispense Refill   dicyclomine (BENTYL) 10 MG capsule Take 1 capsule (10 mg total) by mouth 3 (three) times daily as needed for spasms. 90 capsule 0   pantoprazole (PROTONIX) 40 MG tablet Take 1 tablet (40 mg total) by mouth 2 (two) times daily. TAKE ONE (1) TABLET EACH DAY 180 tablet 1   cyanocobalamin (VITAMIN B12) 1000 MCG/ML injection 1000 mcg [place]/IM every day for one week, then 1000 mcg once every week for one month. Then 1000 mcg monthly. 30 mL 1   famotidine (PEPCID) 20 MG tablet Take 1 tablet (20 mg total) by mouth 2 (two) times daily. (Patient not taking: Reported on 09/26/2022) 180 tablet 1   valACYclovir (VALTREX) 1000 MG tablet TAKE ONE TABLET TWICE DAILY AS NEEDED 60 tablet 2   Current Facility-Administered Medications  Medication Dose Route Frequency Provider Last Rate Last Admin   0.9 %  sodium chloride infusion  500 mL Intravenous Once Imogene Burn, MD        Allergies as of 11/22/2022   (No Known Allergies)    Family History  Problem Relation Age of Onset   Anxiety disorder Mother    Hypothyroidism Father    Arthritis Father    Thyroid disease Father    Diabetes Sister    Colon cancer Maternal  Grandmother 37   Breast cancer Paternal Grandmother    Cancer Paternal Uncle        Bile duct    Social History   Socioeconomic History   Marital status: Married    Spouse name: Not on file   Number of children: 1   Years of education: Not on file   Highest education level: Not on file  Occupational History   Not on file  Tobacco Use   Smoking status: Never   Smokeless tobacco: Never  Vaping Use   Vaping Use: Every day  Substance and Sexual Activity   Alcohol use: Yes    Comment: 2 beers a month   Drug use: No   Sexual activity: Yes    Birth control/protection: None  Other Topics Concern   Not on file  Social History Narrative   Not on file    Social Determinants of Health   Financial Resource Strain: Not on file  Food Insecurity: Not on file  Transportation Needs: Not on file  Physical Activity: Not on file  Stress: Not on file  Social Connections: Not on file  Intimate Partner Violence: Not on file    Physical Exam: Vital signs in last 24 hours: BP 130/66   Pulse 60   Temp 98.6 F (37 C) (Temporal)   Ht 6' (1.829 m)   Wt 239 lb (108.4 kg)   SpO2 99%   BMI 32.41 kg/m  GEN: NAD EYE: Sclerae anicteric ENT: MMM CV: Non-tachycardic Pulm: No increased WOB GI: Soft NEURO:  Alert & Oriented   Eulah Pont, MD [facility]   11/22/2022 9:22 AM

## 2022-11-22 NOTE — Progress Notes (Signed)
Called to room to assist during endoscopic procedure.  Patient ID and intended procedure confirmed with present staff. Received instructions for my participation in the procedure from the performing physician.  

## 2022-11-22 NOTE — Patient Instructions (Addendum)
Handout provided on gastritis.  Await pathology results.  Please make sure you are taking the Pantoprazole 40mg  twice daily to give it a good trial of seeing if it helps your symptoms.  Return to GI clinic in 4-6 weeks. Check your MyChart for appointment details.   YOU HAD AN ENDOSCOPIC PROCEDURE TODAY AT THE [facility]:   Refer to the procedure report that was given to you for any specific questions about what was found during the examination.  If the procedure report does not answer your questions, please call your gastroenterologist to clarify.  If you requested that your care partner not be given the details of your procedure findings, then the procedure report has been included in a sealed envelope for you to review at your convenience later.  YOU SHOULD EXPECT: Some feelings of bloating in the abdomen. Passage of more gas than usual.  Walking can help get rid of the air that was put into your GI tract during the procedure and reduce the bloating. If you had a lower endoscopy (such as a colonoscopy or flexible sigmoidoscopy) you may notice spotting of blood in your stool or on the toilet paper. If you underwent a bowel prep for your procedure, you may not have a normal bowel movement for a few days.  Please Note:  You might notice some irritation and congestion in your nose or some drainage.  This is from the oxygen used during your procedure.  There is no need for concern and it should clear up in a day or so.  SYMPTOMS TO REPORT IMMEDIATELY:  Following upper endoscopy (EGD)  Vomiting of blood or coffee ground material  New chest pain or pain under the shoulder blades  Painful or persistently difficult swallowing  New shortness of breath  Fever of 100F or higher  Black, tarry-looking stools  For urgent or emergent issues, a gastroenterologist can be reached at any hour by calling [phone]. Do not use MyChart messaging for urgent concerns.    DIET:  We do recommend  a small meal at first, but then you may proceed to your regular diet.  Drink plenty of fluids but you should avoid alcoholic beverages for 24 hours.  ACTIVITY:  You should plan to take it easy for the rest of today and you should NOT DRIVE or use heavy machinery until tomorrow (because of the sedation medicines used during the test).    FOLLOW UP: Our staff will call the number listed on your records the next business day following your procedure.  We will call around 7:15- 8:00 am to check on you and address any questions or concerns that you may have regarding the information given to you following your procedure. If we do not reach you, we will leave a message.     If any biopsies were taken you will be contacted by phone or by letter within the next 1-3 weeks.  Please call us at [phone] if you have not heard about the biopsies in 3 weeks.    SIGNATURES/CONFIDENTIALITY: You and/or your care partner have signed paperwork which will be entered into your electronic medical record.  These signatures attest to the fact that that the information above on your After Visit Summary has been reviewed and is understood.  Full responsibility of the confidentiality of this discharge information lies with you and/or your care-partner.

## 2022-11-22 NOTE — Telephone Encounter (Signed)
Called and spoke with patient. Pt has been scheduled for a follow up with Dr. Leonides Schanz on Thursday, 12/28/22 at 1:30 pm. Pt verbalized understanding and had no concerns at the end of the call.

## 2022-11-23 ENCOUNTER — Telehealth: Payer: Self-pay

## 2022-11-23 NOTE — Telephone Encounter (Signed)
  Follow up Call-     11/22/2022    7:31 AM  Call back number  Post procedure Call Back phone  # [phone]  Permission to leave phone message Yes     Patient questions:  Do you have a fever, pain , or abdominal swelling? No. Pain Score  0 *  Have you tolerated food without any problems? Yes.    Have you been able to return to your normal activities? Yes.    Do you have any questions about your discharge instructions: Diet   No. Medications  No. Follow up visit  No.  Do you have questions or concerns about your Care? No.  Actions: * If pain score is 4 or above: No action needed, pain <4.

## 2022-11-24 ENCOUNTER — Encounter: Payer: Self-pay | Admitting: Internal Medicine

## 2022-12-28 ENCOUNTER — Encounter: Payer: Self-pay | Admitting: Internal Medicine

## 2022-12-28 ENCOUNTER — Ambulatory Visit: Payer: BC Managed Care – PPO | Admitting: Internal Medicine

## 2022-12-28 VITALS — BP 136/70 | HR 63 | Ht 72.0 in

## 2022-12-28 DIAGNOSIS — K219 Gastro-esophageal reflux disease without esophagitis: Secondary | ICD-10-CM | POA: Diagnosis not present

## 2022-12-28 DIAGNOSIS — K589 Irritable bowel syndrome without diarrhea: Secondary | ICD-10-CM

## 2022-12-28 DIAGNOSIS — K760 Fatty (change of) liver, not elsewhere classified: Secondary | ICD-10-CM

## 2022-12-28 MED ORDER — DICYCLOMINE HCL 10 MG PO CAPS: 10.0000 mg | ORAL_CAPSULE | Freq: Three times a day (TID) | ORAL | 1 refills | Status: AC | PRN

## 2022-12-28 MED ORDER — PANTOPRAZOLE SODIUM 40 MG PO TBEC
40.0000 mg | DELAYED_RELEASE_TABLET | Freq: Two times a day (BID) | ORAL | 1 refills | Status: AC
Start: 2022-12-28 — End: ?

## 2022-12-28 NOTE — Progress Notes (Signed)
Chief Complaint: GERD  HPI : 27 year old male with history of obesity, GERD, and asthma presents for follow up of GERD  Interval History: Overall he has had improvement in his symptoms. He has been taking the PPI mostly BID and sometimes QD. The PPI therapy is definitely helping. If he takes the PPI BID, then he does not have any breakthrough symptoms. If he takes PPI every day, then he may feel symptoms depending on what he eats. Denies any abdominal pain or diarrhea. He avoids dairy. He has been taking Bentyl once his burping starts because he knows that his abdominal pain and diarrhea will start afterwards. However, since he has been on PPI therapy and Bentyl PRN, he has not had any recurrence in his ab pain and N&V. In the past he has not noticed any inciting factors for the onset of his GI symptoms. Weight has been stable since his last clinic visit. Denies blood in the stools.  Wt Readings from Last 3 Encounters:  11/22/22 239 lb (108.4 kg)  09/26/22 239 lb 6 oz (108.6 kg)  08/24/22 252 lb 12.8 oz (114.7 kg)   Past Medical History:  Diagnosis Date   Arthritis    Asthma    GERD (gastroesophageal reflux disease)    Obesity    Past Surgical History:  Procedure Laterality Date   FINGER SURGERY Left    ring finger   TONSILLECTOMY     Family History  Problem Relation Age of Onset   Anxiety disorder Mother    Hypothyroidism Father    Arthritis Father    Thyroid disease Father    Diabetes Sister    Colon cancer Maternal Grandmother 37   Breast cancer Paternal Grandmother    Cancer Paternal Uncle        Bile duct   Social History   Tobacco Use   Smoking status: Never   Smokeless tobacco: Never  Vaping Use   Vaping Use: Every day  Substance Use Topics   Alcohol use: Yes    Comment: 2 beers a month   Drug use: No   Current Outpatient Medications  Medication Sig Dispense Refill   cyanocobalamin (VITAMIN B12) 1000 MCG/ML injection 1000 mcg [facility]/IM every day for one week,  then 1000 mcg once every week for one month. Then 1000 mcg monthly. 30 mL 1   pantoprazole (PROTONIX) 40 MG tablet Take 1 tablet (40 mg total) by mouth 2 (two) times daily. TAKE ONE (1) TABLET EACH DAY 180 tablet 1   dicyclomine (BENTYL) 10 MG capsule Take 1 capsule (10 mg total) by mouth 3 (three) times daily as needed for spasms. 90 capsule 0   famotidine (PEPCID) 20 MG tablet Take 1 tablet (20 mg total) by mouth 2 (two) times daily. (Patient not taking: Reported on 12/28/2022) 180 tablet 1   valACYclovir (VALTREX) 1000 MG tablet TAKE ONE TABLET TWICE DAILY AS NEEDED (Patient not taking: Reported on 12/28/2022) 60 tablet 2   No current facility-administered medications for this visit.   No Known Allergies   Review of Systems: All systems reviewed and negative except where noted in HPI.   Physical Exam: BP 136/70   Pulse 63   Ht 6' (1.829 m)   BMI 32.41 kg/m  Constitutional: Pleasant,well-developed, male in no acute distress. HEENT: Normocephalic and atraumatic. Conjunctivae are normal. No scleral icterus. Cardiovascular: Normal rate, regular rhythm.  Pulmonary/chest: Effort normal and breath sounds normal. No wheezing, rales or rhonchi. Abdominal: Soft, nondistended, non-tender. Bowel sounds  active throughout. There are no masses palpable. No hepatomegaly. Extremities: No edema Neurological: Alert and oriented to person place and time. Skin: Skin is warm and dry. No rashes noted. Psychiatric: Normal mood and affect. Behavior is normal.  Labs 09/2019: CMP with mildly elevated ALT of 59.   Labs 08/2022: CBC nml. CMP with nml LFTs. TSH nml. Vitamin B12 low at 146.  Labs 09/2022: TTG IgA nml. IgA nml. Lipase nml. CRP nml. Alpha gal panel negative.  CT Abdomen w/contrast 10/07/19: IMPRESSION: 1. No acute findings or explanation for the patient's symptoms. 2. Subjective mild hepatic steatosis. 3. Bilateral L4 pars fractures, likely nonacute.  RUQ U/S 10/06/22: IMPRESSION: 1. Hepatic  steatosis. Please note limited evaluation for focal hepatic masses in a patient with hepatic steatosis due to decreased penetration of the acoustic ultrasound waves. 2. No cholelithiasis or sonographic evidence of acute cholecystitis.  EGD 11/22/22:  Path: 1. Surgical [P], duodenal bx - BENIGN SMALL BOWEL MUCOSA WITH NO SIGNIFICANT PATHOLOGIC CHANGES 2. Surgical [P], gastric bx - GASTRIC ANTRAL AND OXYNTIC MUCOSA WITH NO SPECIFIC PATHOLOGIC CHANGES - NEGATIVE FOR H. PYLORI ON H&E STAIN - NEGATIVE FOR INTESTINAL METAPLASIA OR MALIGNANCY 3. Surgical [P], esophageal bx - SQUAMOUS MUCOSA WITH NO SPECIFIC PATHOLOGIC CHANGES - NEGATIVE FOR INCREASED INTRAEPITHELIAL EOSINOPHILS - NEGATIVE FOR DYSPLASIA OR MALIGNANCY  ASSESSMENT AND PLAN: GERD N&V Epigastric and lower abdominal pain Loose stools Fatty liver Vitamin B12 deficiency Patient presents for follow up of GERD, N&V, epigastric and lower abdominal pain, and loose stools, which seem to be adequately controlled on a combination of PPI BID and Bentyl PRN. Suspect his symptoms are due to a combination of GERD and IBS at this time. Prior lab work up, RUQ U/S, and EGD did not show any obvious source of his abdominal symptoms. He was noted to have fatty liver, but FIB-4 is consistent with F0/1 fibrosis. - Previously gave GERD handout - Continue to avoid lactose - Continue pantoprazole 40 mg BID. Refilled - Bentyl 10 mg TID PRN for abdominal pain. Refilled - Return 6 months   Eulah Pont, MD  I spent 35 minutes of time, including in depth chart review, independent review of results as outlined above, communicating results with the patient directly, face-to-face time with the patient, coordinating care, ordering studies and medications as appropriate, and documentation.

## 2022-12-28 NOTE — Patient Instructions (Addendum)
We have sent the following medications to your pharmacy for you to pick up at your convenience: Pantoprazole, Bentyl  Follow up in 6 months  If your blood pressure at your visit was 140/90 or greater, please contact your primary care physician to follow up on this.  _______________________________________________________  If you are age 27 or older, your body mass index should be between 23-30. Your Body mass index is 32.41 kg/m. If this is out of the aforementioned range listed, please consider follow up with your Primary Care Provider.  If you are age 38 or younger, your body mass index should be between 19-25. Your Body mass index is 32.41 kg/m. If this is out of the aformentioned range listed, please consider follow up with your Primary Care Provider.   ________________________________________________________  The [facility] providers would like to encourage you to use [facility] to communicate with providers for non-urgent requests or questions.  Due to long hold times on the telephone, sending your provider a message by [facility] may be a faster and more efficient way to get a response.  Please allow 48 business hours for a response.  Please remember that this is for non-urgent requests.  _______________________________________________________    Thank you for entrusting me with your care and for choosing [facility], Dr. Eulah Pont

## 2023-05-18 ENCOUNTER — Encounter: Payer: Self-pay | Admitting: Internal Medicine

## 2023-12-14 ENCOUNTER — Other Ambulatory Visit (INDEPENDENT_AMBULATORY_CARE_PROVIDER_SITE_OTHER)

## 2023-12-14 ENCOUNTER — Encounter: Payer: Self-pay | Admitting: Internal Medicine

## 2023-12-14 ENCOUNTER — Ambulatory Visit: Payer: Self-pay | Admitting: Internal Medicine

## 2023-12-14 VITALS — BP 100/80 | HR 60 | Ht 71.5 in | Wt 256.1 lb

## 2023-12-14 DIAGNOSIS — R1030 Lower abdominal pain, unspecified: Secondary | ICD-10-CM

## 2023-12-14 DIAGNOSIS — K219 Gastro-esophageal reflux disease without esophagitis: Secondary | ICD-10-CM | POA: Diagnosis not present

## 2023-12-14 DIAGNOSIS — R112 Nausea with vomiting, unspecified: Secondary | ICD-10-CM

## 2023-12-14 DIAGNOSIS — R197 Diarrhea, unspecified: Secondary | ICD-10-CM | POA: Diagnosis not present

## 2023-12-14 DIAGNOSIS — Z8639 Personal history of other endocrine, nutritional and metabolic disease: Secondary | ICD-10-CM

## 2023-12-14 DIAGNOSIS — K760 Fatty (change of) liver, not elsewhere classified: Secondary | ICD-10-CM

## 2023-12-14 LAB — CBC WITH DIFFERENTIAL/PLATELET
Basophils Absolute: 0 10*3/uL (ref 0.0–0.1)
Basophils Relative: 0.5 % (ref 0.0–3.0)
Eosinophils Absolute: 0.3 10*3/uL (ref 0.0–0.7)
Eosinophils Relative: 4 % (ref 0.0–5.0)
HCT: 44.4 % (ref 39.0–52.0)
Hemoglobin: 15.2 g/dL (ref 13.0–17.0)
Lymphocytes Relative: 20.1 % (ref 12.0–46.0)
Lymphs Abs: 1.6 10*3/uL (ref 0.7–4.0)
MCHC: 34.4 g/dL (ref 30.0–36.0)
MCV: 86.1 fl (ref 78.0–100.0)
Monocytes Absolute: 0.4 10*3/uL (ref 0.1–1.0)
Monocytes Relative: 5.3 % (ref 3.0–12.0)
Neutro Abs: 5.4 10*3/uL (ref 1.4–7.7)
Neutrophils Relative %: 70.1 % (ref 43.0–77.0)
Platelets: 253 10*3/uL (ref 150.0–400.0)
RBC: 5.15 Mil/uL (ref 4.22–5.81)
RDW: 13.2 % (ref 11.5–15.5)
WBC: 7.7 10*3/uL (ref 4.0–10.5)

## 2023-12-14 LAB — COMPREHENSIVE METABOLIC PANEL WITH GFR
ALT: 39 U/L (ref 0–53)
AST: 20 U/L (ref 0–37)
Albumin: 4.5 g/dL (ref 3.5–5.2)
Alkaline Phosphatase: 51 U/L (ref 39–117)
BUN: 11 mg/dL (ref 6–23)
CO2: 30 meq/L (ref 19–32)
Calcium: 9 mg/dL (ref 8.4–10.5)
Chloride: 104 meq/L (ref 96–112)
Creatinine, Ser: 1.07 mg/dL (ref 0.40–1.50)
GFR: 94.76 mL/min (ref >60.00)
Glucose, Bld: 93 mg/dL (ref 70–99)
Potassium: 3.9 meq/L (ref 3.5–5.1)
Sodium: 140 meq/L (ref 135–145)
Total Bilirubin: 1.2 mg/dL (ref 0.2–1.2)
Total Protein: 7.3 g/dL (ref 6.0–8.3)

## 2023-12-14 LAB — IBC + FERRITIN
Ferritin: 179 ng/mL (ref 22.0–322.0)
Iron: 79 ug/dL (ref 42–165)
Saturation Ratios: 21.6 % (ref 20.0–50.0)
TIBC: 365.4 ug/dL (ref 250.0–450.0)
Transferrin: 261 mg/dL (ref 212.0–360.0)

## 2023-12-14 LAB — LIPASE: Lipase: 25 U/L (ref 11.0–59.0)

## 2023-12-14 LAB — VITAMIN B12: Vitamin B-12: 133 pg/mL — ABNORMAL LOW (ref 211–911)

## 2023-12-14 LAB — VITAMIN D 25 HYDROXY (VIT D DEFICIENCY, FRACTURES): VITD: 27.67 ng/mL — ABNORMAL LOW (ref 30.00–100.00)

## 2023-12-14 LAB — C-REACTIVE PROTEIN: CRP: <1 mg/dL (ref 0.5–20.0)

## 2023-12-14 MED ORDER — FAMOTIDINE 20 MG PO TABS
20.0000 mg | ORAL_TABLET | Freq: Two times a day (BID) | ORAL | 1 refills | Status: AC
Start: 2023-12-14 — End: ?

## 2023-12-14 MED ORDER — PANTOPRAZOLE SODIUM 40 MG PO TBEC: 40.0000 mg | DELAYED_RELEASE_TABLET | Freq: Two times a day (BID) | ORAL | 1 refills | Status: DC

## 2023-12-14 NOTE — Patient Instructions (Addendum)
 _______________________________________________________  If your blood pressure at your visit was 140/90 or greater, please contact your primary care physician to follow up on this.  _______________________________________________________  If you are age 28 or older, your body mass index should be between 23-30. Your Body mass index is 35.22 kg/m. If this is out of the aforementioned range listed, please consider follow up with your Primary Care Provider.  If you are age 64 or younger, your body mass index should be between 19-25. Your Body mass index is 35.22 kg/m. If this is out of the aformentioned range listed, please consider follow up with your Primary Care Provider.   ________________________________________________________  The [facility] providers would like to encourage you to use [facility] to communicate with providers for non-urgent requests or questions.  Due to long hold times on the telephone, sending your provider a message by [facility] may be a faster and more efficient way to get a response.  Please allow 48 business hours for a response.  Please remember that this is for non-urgent requests.  _______________________________________________________  Your provider has requested that you go to the basement level for lab work before leaving today. Press B on the elevator. The lab is located at the first door on the left as you exit the elevator.  We have sent the following medications to your pharmacy for you to pick up at your convenience: Protonix  2 times a day Pepcid   You have been given a testing kit to check for small intestine bacterial overgrowth (SIBO) which is completed by a company named [facility]. Make sure to return your test in the mail using the return mailing label given to you along with the kit. The test order, your demographic and insurance information have all already been sent to the company. [facility] will collect an upfront charge of $109.00 for  commercial insurance plans and $229.00 if you are paying cash. The potential remaining total after claim submission and review is $120.00. Make sure to discuss with [facility] PRIOR to having the test to see if they have gotten information from your insurance company as to how much your testing will cost out of pocket, if any. Please contact [facility] at phone number [phone] to get instructions regarding how to perform the test as our office is unable to give specific testing instructions.   Please follow up in 3 months. Give us  a call at [phone] to schedule an appointment.  It was a pleasure to see you today!  Thank you for trusting me with your gastrointestinal care!

## 2023-12-14 NOTE — Progress Notes (Signed)
 Chief Complaint: GERD  HPI : 28 year old male with history of obesity, GERD, and asthma presents for follow up of GERD  Interval History: This past Friday he was at work and then he came home and he had steak and green beans for dinner. When it was time to eat, he was not that hungry. That night he started getting burping again. He tried to stay up because he knew that a flare of his burping/vomiting/diarrhea was coming on, and he took his dicyclomine . He fell asleep at 1 AM. He tried to sleep sitting up and then he went to the bathroom and then started having diarrhea along with vomiting. He was feeling sharp pains in the bottom of his abdomen at that time. Until 4:45 AM, he was having constant diarrhea and vomiting. He didn't eat for a couple days after that. Afterwards, he will burp very frequently for a few days afterwards. Denies being around anyone else who gets sick. Denies dysphagia. He doesn't tend to season foods heavily. He takes the pantoprazole  40 mg once daily. He no longer takes the Pepcid  and doesn't know where that medicine is. Denies headaches prior to the onset of the symptoms. Denies dizziness. Denies marijuana or Delta 8 use. The stools that come out during that episode are pure diarrhea. He normally has 1-2 BMs per day. Denies blood in the stools. Denies bloating. Denies fevers. Antibiotics do seem to help with his symptoms.  Wt Readings from Last 3 Encounters:  12/14/23 256 lb 2 oz (116.2 kg)  11/22/22 239 lb (108.4 kg)  09/26/22 239 lb 6 oz (108.6 kg)    Past Medical History:  Diagnosis Date   Arthritis    Asthma    GERD (gastroesophageal reflux disease)    Obesity    Past Surgical History:  Procedure Laterality Date   FINGER SURGERY Left    ring finger   TONSILLECTOMY     Family History  Problem Relation Age of Onset   Anxiety disorder Mother    Hypothyroidism Father    Arthritis Father    Thyroid  disease Father    Diabetes Sister    Colon cancer Maternal  Grandmother 37   Breast cancer Paternal Grandmother    Cancer Paternal Uncle        Bile duct   Social History   Tobacco Use   Smoking status: Never   Smokeless tobacco: Never  Vaping Use   Vaping status: Every Day  Substance Use Topics   Alcohol use: Yes    Comment: 2 beers a month   Drug use: No   Current Outpatient Medications  Medication Sig Dispense Refill   dicyclomine  (BENTYL ) 10 MG capsule Take 1 capsule (10 mg total) by mouth 3 (three) times daily as needed for spasms. 90 capsule 1   pantoprazole  (PROTONIX ) 40 MG tablet Take 1 tablet (40 mg total) by mouth 2 (two) times daily. TAKE ONE (1) TABLET EACH DAY 180 tablet 1   valACYclovir  (VALTREX ) 1000 MG tablet TAKE ONE TABLET TWICE DAILY AS NEEDED 60 tablet 2   cyanocobalamin  (VITAMIN B12) 1000 MCG/ML injection 1000 mcg [place]/IM every day for one week, then 1000 mcg once every week for one month. Then 1000 mcg monthly. 30 mL 1   famotidine  (PEPCID ) 20 MG tablet Take 1 tablet (20 mg total) by mouth 2 (two) times daily. (Patient not taking: Reported on 12/28/2022) 180 tablet 1   No current facility-administered medications for this visit.   No Known Allergies  Physical Exam: BP 100/80 (BP Location: Left Arm, Patient Position: Sitting, Cuff Size: Large)   Pulse 60   Ht 5' 11.5 (1.816 m) Comment: height measured without shoes  Wt 256 lb 2 oz (116.2 kg)   BMI 35.22 kg/m  Constitutional: Pleasant,well-developed, male in no acute distress. HEENT: Normocephalic and atraumatic. Conjunctivae are normal. No scleral icterus. Cardiovascular: Normal rate, regular rhythm.  Pulmonary/chest: Effort normal and breath sounds normal. No wheezing, rales or rhonchi. Abdominal: Soft, nondistended, tender in the lower abdomen. Bowel sounds active throughout. There are no masses palpable. No hepatomegaly. Extremities: No edema Neurological: Alert and oriented to person place and time. Skin: Skin is warm and dry. No rashes noted. Psychiatric:  Normal mood and affect. Behavior is normal.  Labs 09/2019: CMP with mildly elevated ALT of 59.   Labs 08/2022: CBC nml. CMP with nml LFTs. TSH nml. Vitamin B12 low at 146.  Labs 09/2022: TTG IgA nml. IgA nml. Lipase nml. CRP nml. Alpha gal panel negative.  CT Abdomen w/contrast 10/07/19: IMPRESSION: 1. No acute findings or explanation for the patient's symptoms. 2. Subjective mild hepatic steatosis. 3. Bilateral L4 pars fractures, likely nonacute.  RUQ U/S 10/06/22: IMPRESSION: 1. Hepatic steatosis. Please note limited evaluation for focal hepatic masses in a patient with hepatic steatosis due to decreased penetration of the acoustic ultrasound waves. 2. No cholelithiasis or sonographic evidence of acute cholecystitis.  EGD 11/22/22:  Path: 1. Surgical [P], duodenal bx - BENIGN SMALL BOWEL MUCOSA WITH NO SIGNIFICANT PATHOLOGIC CHANGES 2. Surgical [P], gastric bx - GASTRIC ANTRAL AND OXYNTIC MUCOSA WITH NO SPECIFIC PATHOLOGIC CHANGES - NEGATIVE FOR H. PYLORI ON H&E STAIN - NEGATIVE FOR INTESTINAL METAPLASIA OR MALIGNANCY 3. Surgical [P], esophageal bx - SQUAMOUS MUCOSA WITH NO SPECIFIC PATHOLOGIC CHANGES - NEGATIVE FOR INCREASED INTRAEPITHELIAL EOSINOPHILS - NEGATIVE FOR DYSPLASIA OR MALIGNANCY  ASSESSMENT AND PLAN: GERD N&V Lower abdominal pain Diarrhea Fatty liver History of vitamin B12 deficiency Patient had a recent flare of nausea, vomiting, lower abdominal pain, and diarrhea. Previously suspected that his symptoms were due to a combination of GERD and IBS. Prior lab work up, RUQ U/S, and EGD did not show any obvious source of his abdominal symptoms. Since patient had a recurrence in his symptoms that did not adequately respond to PPI every day and Bentyl  PRN, will plan for further work up with labs and SIBO breath test (patient does note that he feels better when he is on antibiotic therapy). Will increase his PPI to BID to try to help settle out his burping and get any  uncontrolled reflux under better control. - Previously gave GERD handout - Continue to avoid lactose - Check CBC, CMP, CRP, lipase, vitamin B12, vitamin D , ferritin/IBC - Check fecal calprotectin - Increase pantoprazole  40 mg every day to BID. Refilled - Continue Bentyl  10 mg TID PRN for abdominal pain - Refill Pepcid  to use PRN for GERD - SIBO breath test - RTC in 3 months. Consider GES in the future  Regino Caprio, MD  I spent 36 minutes of time, including in depth chart review, independent review of results as outlined above, communicating results with the patient directly, face-to-face time with the patient, coordinating care, ordering studies and medications as appropriate, and documentation.

## 2023-12-17 ENCOUNTER — Ambulatory Visit: Payer: Self-pay | Admitting: Internal Medicine

## 2024-04-10 ENCOUNTER — Other Ambulatory Visit: Payer: Self-pay | Admitting: Internal Medicine

## 2024-04-10 DIAGNOSIS — K219 Gastro-esophageal reflux disease without esophagitis: Secondary | ICD-10-CM

## 2024-07-24 ENCOUNTER — Other Ambulatory Visit: Payer: Self-pay

## 2024-07-24 MED ORDER — PANTOPRAZOLE SODIUM 40 MG PO TBEC: 40.0000 mg | DELAYED_RELEASE_TABLET | Freq: Two times a day (BID) | ORAL | 1 refills | Status: AC

## 2024-07-24 NOTE — Progress Notes (Signed)
 Request refill
